# Patient Record
Sex: Female | Born: 1965 | Race: Black or African American | Hispanic: No | Marital: Married | Smoking: Never smoker
Health system: Southern US, Community
[De-identification: ages and names within clinical notes are randomized; demographics above are authoritative.]

## PROBLEM LIST (undated history)

## (undated) DIAGNOSIS — D500 Iron deficiency anemia secondary to blood loss (chronic): Secondary | ICD-10-CM

## (undated) DIAGNOSIS — F419 Anxiety disorder, unspecified: Secondary | ICD-10-CM

## (undated) DIAGNOSIS — Z862 Personal history of diseases of the blood and blood-forming organs and certain disorders involving the immune mechanism: Secondary | ICD-10-CM

## (undated) DIAGNOSIS — Z87448 Personal history of other diseases of urinary system: Secondary | ICD-10-CM

## (undated) DIAGNOSIS — N390 Urinary tract infection, site not specified: Secondary | ICD-10-CM

## (undated) DIAGNOSIS — E538 Deficiency of other specified B group vitamins: Secondary | ICD-10-CM

## (undated) DIAGNOSIS — D649 Anemia, unspecified: Secondary | ICD-10-CM

## (undated) DIAGNOSIS — A499 Bacterial infection, unspecified: Secondary | ICD-10-CM

## (undated) DIAGNOSIS — G4700 Insomnia, unspecified: Secondary | ICD-10-CM

## (undated) DIAGNOSIS — E559 Vitamin D deficiency, unspecified: Secondary | ICD-10-CM

## (undated) DIAGNOSIS — N611 Abscess of the breast and nipple: Secondary | ICD-10-CM

## (undated) DIAGNOSIS — G43909 Migraine, unspecified, not intractable, without status migrainosus: Secondary | ICD-10-CM

## (undated) DIAGNOSIS — Z8619 Personal history of other infectious and parasitic diseases: Secondary | ICD-10-CM

## (undated) DIAGNOSIS — N898 Other specified noninflammatory disorders of vagina: Secondary | ICD-10-CM

## (undated) DIAGNOSIS — N951 Menopausal and female climacteric states: Secondary | ICD-10-CM

## (undated) DIAGNOSIS — J302 Other seasonal allergic rhinitis: Secondary | ICD-10-CM

## (undated) DIAGNOSIS — N939 Abnormal uterine and vaginal bleeding, unspecified: Secondary | ICD-10-CM

## (undated) HISTORY — DX: Deficiency of other specified B group vitamins: E53.8

## (undated) HISTORY — DX: Urinary tract infection, site not specified: A49.9

## (undated) HISTORY — DX: Personal history of other infectious and parasitic diseases: Z86.19

## (undated) HISTORY — DX: Other seasonal allergic rhinitis: J30.2

## (undated) HISTORY — DX: Anemia, unspecified: D64.9

## (undated) HISTORY — DX: Personal history of diseases of the blood and blood-forming organs and certain disorders involving the immune mechanism: Z86.2

## (undated) HISTORY — DX: Migraine, unspecified, not intractable, without status migrainosus: G43.909

## (undated) HISTORY — DX: Personal history of other diseases of urinary system: Z87.448

## (undated) HISTORY — DX: Urinary tract infection, site not specified: N39.0

## (undated) HISTORY — PX: EYE SURGERY: SHX253

## (undated) HISTORY — DX: Abnormal uterine and vaginal bleeding, unspecified: N93.9

## (undated) HISTORY — DX: Iron deficiency anemia secondary to blood loss (chronic): D50.0

## (undated) HISTORY — DX: Vitamin D deficiency, unspecified: E55.9

## (undated) HISTORY — DX: Insomnia, unspecified: G47.00

## (undated) HISTORY — DX: Anxiety disorder, unspecified: F41.9

## (undated) HISTORY — DX: Menopausal and female climacteric states: N95.1

## (undated) HISTORY — DX: Other specified noninflammatory disorders of vagina: N89.8

## (undated) HISTORY — PX: TUBAL LIGATION: SHX77

## (undated) HISTORY — DX: Abscess of the breast and nipple: N61.1

---

## 2003-10-25 ENCOUNTER — Other Ambulatory Visit: Admission: RE | Admit: 2003-10-25 | Discharge: 2003-10-25 | Payer: Self-pay | Admitting: Obstetrics and Gynecology

## 2006-02-10 LAB — HM MAMMOGRAPHY: HM Mammogram: NEGATIVE

## 2006-03-23 LAB — LIPID PANEL
LDL/HDL Ratio: 1.5
TSH: 1.592
Triglycerides: 45
VLDL: 9 mg/dL

## 2008-02-11 HISTORY — PX: TYMPANOPLASTY: SHX33

## 2008-07-11 LAB — CONVERTED CEMR LAB: Pap Smear: NEGATIVE

## 2009-06-08 ENCOUNTER — Encounter: Payer: Self-pay | Admitting: Family Medicine

## 2009-06-15 ENCOUNTER — Encounter: Admission: RE | Admit: 2009-06-15 | Discharge: 2009-06-15 | Payer: Self-pay | Admitting: Family Medicine

## 2009-06-15 ENCOUNTER — Ambulatory Visit: Payer: Self-pay | Admitting: Family Medicine

## 2009-06-18 ENCOUNTER — Encounter: Payer: Self-pay | Admitting: Family Medicine

## 2009-06-18 ENCOUNTER — Ambulatory Visit: Payer: Self-pay | Admitting: Family Medicine

## 2009-06-19 ENCOUNTER — Telehealth: Payer: Self-pay | Admitting: Family Medicine

## 2009-06-20 LAB — CONVERTED CEMR LAB
ALT: 79 units/L — ABNORMAL HIGH (ref 0–35)
AST: 159 units/L — ABNORMAL HIGH (ref 0–37)
Albumin: 3.9 g/dL (ref 3.5–5.2)
Cholesterol: 148 mg/dL (ref 0–200)
Creatinine, Ser: 0.7 mg/dL (ref 0.40–1.20)
Glucose, Bld: 86 mg/dL (ref 70–99)
HCT: 36.6 % (ref 36.0–46.0)
LDL Cholesterol: 78 mg/dL (ref 0–99)
MCHC: 31.4 g/dL (ref 30.0–36.0)
Platelets: 279 10*3/uL (ref 150–400)
Sodium: 140 meq/L (ref 135–145)
Total Bilirubin: 0.2 mg/dL — ABNORMAL LOW (ref 0.3–1.2)
Triglycerides: 67 mg/dL (ref <150)
WBC: 5.3 10*3/uL (ref 4.0–10.5)

## 2009-07-23 ENCOUNTER — Encounter: Payer: Self-pay | Admitting: Family Medicine

## 2009-07-23 ENCOUNTER — Ambulatory Visit: Payer: Self-pay | Admitting: Family Medicine

## 2009-07-23 DIAGNOSIS — R4586 Emotional lability: Secondary | ICD-10-CM | POA: Insufficient documentation

## 2009-07-23 LAB — CONVERTED CEMR LAB
Alkaline Phosphatase: 61 units/L (ref 39–117)
BUN: 11 mg/dL (ref 6–23)
CO2: 22 meq/L (ref 19–32)
Calcium: 9 mg/dL (ref 8.4–10.5)
Glucose, Bld: 91 mg/dL (ref 70–99)
Potassium: 3.9 meq/L (ref 3.5–5.3)
TSH: 1.281 microintl units/mL (ref 0.350–4.500)

## 2009-07-24 ENCOUNTER — Telehealth: Payer: Self-pay | Admitting: *Deleted

## 2009-11-15 ENCOUNTER — Ambulatory Visit: Payer: Self-pay | Admitting: Family Medicine

## 2010-02-13 ENCOUNTER — Telehealth: Payer: Self-pay | Admitting: *Deleted

## 2010-02-19 ENCOUNTER — Ambulatory Visit
Admission: RE | Admit: 2010-02-19 | Discharge: 2010-02-19 | Payer: Self-pay | Source: Home / Self Care | Attending: Family Medicine | Admitting: Family Medicine

## 2010-02-19 DIAGNOSIS — G4700 Insomnia, unspecified: Secondary | ICD-10-CM | POA: Insufficient documentation

## 2010-02-19 LAB — CONVERTED CEMR LAB
Specific Gravity, Urine: 1.025
WBC, UA: >20 cells/hpf
pH: 5.5

## 2010-03-03 ENCOUNTER — Encounter: Payer: Self-pay | Admitting: Obstetrics and Gynecology

## 2010-03-12 NOTE — Assessment & Plan Note (Signed)
Summary: liver test/eo   Vital Signs:  Patient profile:   45 year old female Weight:      193.5 pounds BMI:     28.47 Pulse rate:   78 / minute BP sitting:   118 / 68  (right arm) Cuff size:   regular  Vitals Entered By: Renato Battles slade,cma CC: Ruiz/up labs. may repeat? Is Patient Diabetic? No Pain Assessment Patient in pain? no        Primary Care Provider:  Anahy Esh MD  CC:  Ruiz/up labs. may repeat?.  History of Present Illness: Colleen Ruiz here for Ruiz/u of elevated LFTs at last visit.  When I walked into the exam room, I greeted the patient and asked how she was doing.  She started to become tearful.  I asked what was wrong and she said, "I gained weight.  I feel so bad about that."  (Note: pt has gained 4.5 lbs since last ov 06/15/09).  I asked what happened and she said that she has not been working out as much as she has hoped.  There are times that she is supposed to go to the gym, but she would get upset with her husband and would not go.  We began to discuss why she does this.  She becomes more tearful again.  I asked if there was something else happening that may make her tearful like this.  I asked about her work.  She states that work is stressful, but is always stressful (she works at [employer] in [place]).  I asked how her children were doing.  She states that she misses her son tremendously.  He has recently left home for the [employer].  She states that her life has been taking care of her children and her family.  I asked if she was doing anything fun, just for herself.  She states that her mother did not take care of her well so she made it a point to always be available for her family.  We began to discuss that she should take time for herself too.  She likes to go dancing and she likes to spend time with her sister.  Her sister is also married, but will not going anywhere without her husband and her husband does not enjoy dancing, so Nelia and her sister don't get to go dancing.  I  asked about spending time with her daughter, like a "Girls' Day."  She said that she hadn't thought of that, but it would be fun because she and her daughter have a good time toghether.    During the rest of the conversation Linnette revealed has seen a counselor in the past (10 yrs ago) and thougth that it was helpful.       Habits & Providers  Alcohol-Tobacco-Diet     Alcohol drinks/day: 0     Tobacco Status: never  Current Medications (verified): 1)  Ambien 10 Mg Tabs (Zolpidem Tartrate) .Marland Kitchen.. 1 By Mouth At Bedtime For Insomnia 2)  Multivitamins  Tabs (Multiple Vitamin) 3)  Cetirizine Hcl 10 Mg Tabs (Cetirizine Hcl) .Marland Kitchen.. 1 By Mouth Each Night 4)  Hydrocortisone 1 % Crea (Hydrocortisone) .... Apply To Affected Areas Two Times A Day. 30 Gram.  Allergies (verified): 1)  ! Sulfa  Past History:  Past Medical History: Last updated: 06/15/2009 HTN Anemia Last pap 07/2008: Dr [name]  Last MMG 07/2018: [facility]  Past Surgical History: Last updated: 06/08/2009 C-Section 11/22/1988 BLT 10/1990 [facility] Sx 2000  Right eardrum repaired 2010  Family History: Last updated: 06/15/2009 Father: passed at age 52; Lung cancer, Heart Disease Mother: HTN DM: Mat grandfather Twin sister with Cervical cancer s/p Hysterectomy Breast cancer: maternal uncle  Social History: Last updated: 06/15/2009 Lives with Husband Nykayla Marcelli, 1966) in [place] Children 23 son, 79 y/o daughter Pets: Psychologist, 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         Mood:  Sad, tearful.   no SI/HI Counseling 10 yrs ago.  Believed that it helped.   Declined starting any meds at this time.     Seems very receptive to speaking with Dr Pascal Lux.    +constipation +hearing her heart rate in her ears -palpitations +fatigue  Physical Exam  General:  Well-developed,well-nourished,in no acute distress; alert,appropriate and cooperative throughout examination. vitals reviewed.   Neck:  No deformities, masses, or tenderness noted. Lungs:  Normal respiratory effort, chest expands symmetrically. Lungs are clear to auscultation, no crackles or wheezes. Heart:  Normal rate and regular rhythm. S1 and S2 normal without gallop, murmur, click, rub or other extra sounds. Psych:  Cognition and judgment appear intact. Alert and cooperative with normal attention span and concentration. No apparent delusions, illusions, hallucinations.normally interactive, good eye contact, not anxious appearing, not agitated, not suicidal, not homicidal, and tearful.     Impression & Recommendations:  Problem # 1:  EMOTIONAL LABILITY (UJW-119.14) Assessment New I did not intend to discuss pt's mood today, but she became tearful during ov (see hpi).  She denies SI/HI.  I will not make Dx of depression at this time.  Pt declined offer of meds.  She was receptive to speaking with Dr Pascal Lux.  I have given her Dr Carola Rhine phone number.  Will check TSH. I will call pt to discuss lab result and Ruiz/u appt.   Orders: [facility]- Est  Level 4 (78295)  Problem # 2:  FATIGUE (ICD-780.79) Assessment: New May be related to emotional lability, stress from work.  Hb in 06/2009 mildly low, but normal in menstruating female, MCV normal at 86.  Will check TSH.   Orders: [facility]SH[facility] (62130-86578) [facility]- Est  Level 4 (99214)  Problem # 3:  OTHER NONSPECIFIC ABNORMAL SERUM ENZYME LEVELS (ICD-790.5) Assessment: Unchanged LFTs elevated with AST:ALT 2:1.  Pt denies etoh consumption.  Will repeat lab today.   Orders: Comp Met-[facility] ([id number]) [facility]- Est  Level 4 (13244)  Complete Medication List: 1)  Ambien 10 Mg Tabs (Zolpidem tartrate) .Marland Kitchen.. 1 by  mouth at bedtime for insomnia 2)  Multivitamins Tabs (Multiple vitamin) 3)  Cetirizine Hcl 10 Mg Tabs (Cetirizine hcl) .Marland Kitchen.. 1 by mouth each night 4)  Hydrocortisone 1 % Crea (Hydrocortisone) .... Apply to affected areas two times a day. 30 gram.  Patient Instructions: 1)  Please schedule a follow-up appointment in 1 month.  2)  I'm sorry you are feeling sad today. 3)  We have discussed some things that can make you feel better. 4)  One of the things you said that may help you feel better is to go out.  You said that you will call your daughter to go out with you. 5)  You also stated that you would like to speak with Dr Pascal Lux. 6)  Her phone number is [phone].  Please call her to schedule an appointment when you are ready.  7)  I'll call you with lab results.   Prescriptions: HYDROCORTISONE 1 % CREA (HYDROCORTISONE) apply to affected areas two times a day. 30 gram.  #1 x 3   Entered and Authorized by:   Angeline Slim MD   Signed by:   Angeline Slim MD on 07/23/2009   Method used:   Electronically to        [facility] [phone]* (retail)       [address]       [place], [place]  [address]       Ph: [phone] or [phone]       Fax: [phone]   RxID:   [id number]

## 2010-03-12 NOTE — Assessment & Plan Note (Signed)
Summary: low back pain   Vital Signs:  Patient profile:   45 year old female Weight:      195.2 pounds Temp:     98.2 degrees F oral Pulse rate:   81 / minute Pulse rhythm:   regular BP sitting:   134 / 86  (left arm) Cuff size:   regular  Vitals Entered By: Loralee Pacas CMA (November 15, 2009 9:02 AM) CC: ? pulled muscle   Primary Care Provider:  Cat Ta MD  CC:  ? pulled muscle.  History of Present Illness: lower back pain: achy, spasms in lower back x 4-5 days. tylenol makes it better in the day.  pain increases at night.   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 no fever.  no urinary or bowel complaints.  States that she had similiar episode of back pain approx 3 years ago.  Habits & Providers  Alcohol-Tobacco-Diet     Alcohol drinks/day: 0     Tobacco Status: never  Current Medications (verified): 1)  Ambien 10 Mg Tabs (Zolpidem Tartrate) .Marland Kitchen.. 1 By Mouth At Bedtime For Insomnia 2)  Multivitamins  Tabs (Multiple Vitamin) 3)  Cetirizine Hcl 10 Mg Tabs (Cetirizine Hcl) .Marland Kitchen.. 1 By Mouth Each Night 4)  Hydrocortisone 1 % Crea (Hydrocortisone) .... Apply To Affected Areas Two Times A Day. 30 Gram. 5)  Flexeril 10 Mg Tabs (Cyclobenzaprine Hcl) .... Take 1 Tablet As Needed For Back Pain At Bedtime  Allergies (verified): 1)  ! Sulfa  Review of Systems       as per hpi  Physical Exam  General:  VSS Well-developed,well-nourished,in no acute distress; alert,appropriate and cooperative throughout examination Msk:  pain with palpation of lower back.   Right lower back pain> than left lower back pain.  negative straight leg raise. strength 5/5 in all 4 extremities.  sensation grossly intact.  Reflexes equal bilateral.    Impression & Recommendations:  Problem # 1:  LOW BACK PAIN, MILD (ICD-724.2) Pt will take tylenol and motrin as directed during the day for pain.  At nighttime can take a flexeril.  If symptoms do not resolve within a week or  if any fever, bowel or bladder changes, or new or worsening of symptoms pt is to return to care.   Her updated medication list for this problem includes:    Flexeril 10 Mg Tabs (Cyclobenzaprine hcl) .Marland Kitchen... Take 1 tablet as needed for back pain at bedtime  Orders: [facility]- Est Level  3 (91478)  Complete Medication List: 1)  Ambien 10 Mg Tabs (Zolpidem tartrate) .Marland Kitchen.. 1 by mouth at bedtime for insomnia 2)  Multivitamins Tabs (Multiple vitamin) 3)  Cetirizine Hcl 10 Mg Tabs (Cetirizine hcl) .Marland Kitchen.. 1 by mouth each night 4)  Hydrocortisone 1 % Crea (Hydrocortisone) .... Apply to affected areas two times a day. 30 gram. 5)  Flexeril 10 Mg Tabs (Cyclobenzaprine hcl) .... Take 1 tablet as needed for back pain at bedtime  Other Orders: Influenza Vaccine NON MCR (29562)  Patient Instructions: 1)  During the day take tylenol 1000mg  every 8 hours you can alternate with ibuprofen 600mg  every 6 hours. 2)  Take flexeril as needed at nighttime. 3)  return if any fever or any changes in bowel or bladder.  Prescriptions: FLEXERIL 10 MG TABS (CYCLOBENZAPRINE HCL) take 1 tablet as needed for back pain at bedtime  #25 x 0   Entered and Authorized by:   Ellin Mayhew MD  Signed by:   Ellin Mayhew MD on 11/15/2009   Method used:   [facility] to        [facility] [phone]* (retail)       [address]       [place], [place]  [address]       Ph: [phone] or [phone]       Fax: [phone]   RxID:   [id number]    Influenza Vaccine    Vaccine Type: Fluvax Non-MCR    Site: left deltoid    Mfr: GlaxoSmithKline    Dose: 0.5 ml    Route: IM    Given by: Garen Grams LPN    Exp. Date: 08/07/2010    Lot #: [id number]    VIS given: 09/04/09 version given November 15, 2009.  Flu Vaccine Consent Questions    Do you have a history of severe allergic reactions to this vaccine? no    Any prior history of allergic reactions to egg and/or gelatin? no    Do you have a  sensitivity to the preservative Thimersol? no    Do you have a past history of Guillan-Barre Syndrome? no    Do you currently have an acute febrile illness? no    Have you ever had a severe reaction to latex? no    Vaccine information given and explained to patient? yes    Are you currently pregnant? no

## 2010-03-12 NOTE — Miscellaneous (Signed)
Summary: Pmhx info  Clinical Lists Changes  Observations: Added new observation of NKA: T (06/08/2009 18:27) Added new observation of ALLERGY REV: Done (06/08/2009 18:27) Added new observation of FAMILY HX: Father: passed at age 45; Lung cancer, Heart Disease Mother:  (06/08/2009 18:27) Added new observation of EXERCISE: 5 /wk (06/08/2009 18:27) Added new observation of EXERCISE TYP: walking, exercise machine  (06/08/2009 18:27) Added new observation of APPROP EXERC: not indicated; exercise is adequate  (06/08/2009 18:27) Added new observation of REGULAREXERC: yes  (06/08/2009 18:27) Added new observation of SOCIAL HX: Lives with Husband Colleen Ruiz, 1966) Pets: Beagles Employed at _____ Education: Some college ____  Never Tobacco, No alcohol, No drugs  Cell: [phone]  (06/08/2009 18:27) Added new observation of PAST SURG HX: C-Section 11/22/1988 BLT 10/1990 [facility] Sx 2000 Right eardrum repaired 2010  (06/08/2009 18:27) Added new observation of PAST MED HX: HTN Anemia Last pap 2010: Dr [name] Last MMG 2008: [facility]  (06/08/2009 18:27)       Past History:  Past Medical History: HTN Anemia Last pap 2010: Dr [name] Last MMG 2008: [facility]  Past Surgical History: C-Section 11/22/1988 BLT 10/1990 [facility] Sx 2000 Right eardrum repaired 2010   Family History: Father: passed at age 46; Lung cancer, Heart Disease Mother:   Social History: Lives with Husband Colleen Ruiz, 1966) Pets: Beagles Employed at _____ Education: Some college ____  Never Tobacco, No alcohol, No drugs  Cell: [phone] Patient Exercise:  yes   Habits & Providers  Exercise-Depression-Behavior     Does Patient Exercise: yes     Exercise Counseling: not indicated; exercise is adequate     Type of exercise: walking, exercise machine     Times/week: 5    Allergies (verified): No Known Drug Allergies

## 2010-03-12 NOTE — Progress Notes (Signed)
Summary: phn msg  Phone Note Call from Patient Call back at Work Phone [phone]   Caller: Patient Summary of Call: pt is returning call Initial call taken by: De Nurse,  July 24, 2009 10:38 AM  Follow-up for Phone Call        Called to let pt know that Liver looks fine.  Thyroid looks fine. Follow-up by: Angeline Slim MD,  July 24, 2009 2:47 PM  Additional Follow-up for Phone Call Additional follow up Details #1::        CALLED PT AND INFORMED OF LABS WNL Additional Follow-up by: Arlyss Repress CMA,,  July 24, 2009 5:07 PM

## 2010-03-12 NOTE — Assessment & Plan Note (Signed)
Summary: NP,tcb   Vital Signs:  Patient profile:   45 year old female Height:      69.25 inches Weight:      189 pounds BMI:     27.81 Pulse rate:   84 / minute BP sitting:   131 / 83  (right arm)  Vitals Entered By: Arlyss Repress CMA, (Jun 15, 2009 2:32 PM)  Nutrition Counseling: Patient's BMI is greater than 25 and therefore counseled on weight management options. CC: establishment with new PCP.  last T-dap ? Is Patient Diabetic? No Pain Assessment Patient in pain? no        Chief Complaint:  establishment with new PCP.  last T-dap ?Colleen Ruiz  History of Present Illness: 45 y/o F here as new patient.    [facility] PCP:  [facility]  Overweight:  BMI 27.  She Drinks: 3 x 20z sodas daily, usually [place] but has switched to [employer].  Last week she started only drinking one-12oz coke per day.  Drinking more water lately.  She is exercising now.  She has joined the [employer] with her husband.  They go at least 3x/week.    Family Planning:  BTL 19 yrs ago  Last pap 07/2008 negative.  No history of abnormal paps.  Dr Colleen Ruiz.  MMG: 07/2008. [facility]  Insomnia:  Takes Ambien almost every night to help her sleep  Cough x 2 months: was treated for bronchitis with atbx x 10 days.  no sputum.  worse at night.  no fevers, chills, no wheezing.  no rhinorrhea.  no ringing in ear.  + drainage + muffled ears.     Habits & Providers  Alcohol-Tobacco-Diet     Alcohol drinks/day: 0     Tobacco Status: never  Exercise-Depression-Behavior     Does Patient Exercise: yes     Exercise Counseling: [employer]     Type of exercise: treadmill, weights     Exercise (avg: min/session): 30-60     Times/week: 3     Have you felt down or hopeless? no     STD Risk: current     Drug Use: never     Seat Belt Use: always     Sun Exposure: rarely  Current Medications (verified): 1)  Ambien 10 Mg Tabs (Zolpidem Tartrate) .Colleen Ruiz.. 1 By Mouth At Bedtime For Insomnia 2)  Multivitamins  Tabs  (Multiple Vitamin) 3)  Cetirizine Hcl 10 Mg Tabs (Cetirizine Hcl) .Colleen Ruiz.. 1 By Mouth Each Night  Allergies (verified): 1)  ! 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 [place] Education: Some college  Never Tobacco, No alcohol, No drugs  Cell: [phone]  Risk Factors: Alcohol Use: 0 (06/15/2009) Exercise: yes (06/15/2009)  Risk Factors: Smoking Status: never (06/15/2009)  Past Medical History: HTN Anemia Last pap 07/2008: Dr Colleen Ruiz  Last MMG 07/2018: [facility]  Family History: Father: passed at age 27; Lung cancer, Heart Disease Mother: HTN DM: Mat grandfather Twin sister with Cervical cancer s/p Hysterectomy Breast cancer: maternal uncle  Social History: Lives with Husband Colleen Ruiz  Colleen Ruiz, 1966) in [place] Children 32 son, 49 y/o daughter Pets: Psychologist, 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 CV:  Denies chest pain or discomfort, difficulty breathing at night, difficulty breathing while lying down, fainting, fatigue, leg cramps with exertion, lightheadness, near fainting, palpitations, shortness of breath with exertion, swelling of  feet, and swelling of hands. Resp:  Complains of cough; denies chest discomfort, chest pain with inspiration, coughing up blood, excessive snoring, hypersomnolence, morning headaches, pleuritic, shortness of breath, sputum productive, and wheezing. GI:  Denies abdominal pain, bloody stools, change in bowel habits, constipation, diarrhea, nausea, and vomiting. GU:  Denies abnormal vaginal bleeding and discharge. MS:  Denies joint pain, joint redness, joint swelling, loss of strength, low back pain, mid back pain, and muscle aches.  Physical Exam  General:  Well-developed,well-nourished,in no acute distress; alert,appropriate and cooperative throughout examination. vitals reviewed.  Mouth:  Oral mucosa and oropharynx without lesions or exudates.  Teeth in good repair. Neck:  No deformities, masses, or tenderness noted. Lungs:  Normal respiratory effort, chest expands symmetrically. Lungs are clear to auscultation, no crackles or wheezes. Heart:  Normal rate and regular rhythm. S1 and S2 normal without gallop, murmur, click, rub or other extra sounds. Abdomen:  Bowel sounds positive,abdomen soft and non-tender without masses, organomegaly or hernias noted. Msk:  No deformity or scoliosis noted of thoracic or lumbar spine.   Pulses:  R and L carotid,radial,femoral,dorsalis pedis and posterior tibial pulses are full and equal bilaterally Extremities:  No clubbing, cyanosis, edema, or deformity noted with normal full range of motion of all joints.   Neurologic:  No cranial nerve deficits noted. Station and gait are normal. Plantar reflexes are down-going bilaterally. DTRs are symmetrical throughout. Sensory, motor and coordinative functions appear intact. Skin:  Intact without suspicious lesions or rashes Cervical Nodes:  No lymphadenopathy noted Axillary Nodes:  No palpable lymphadenopathy Psych:  Oriented X3 and memory intact for recent and remote.      Impression & Recommendations:  Problem #  1:  PHYSICAL EXAMINATION (ICD-V70.0) Assessment New Pt healthy.  NO red flags.  Will check CBC, CMET, FLP.  Pt will get Pap smear at Dr Colleen Ruiz.  Will get MMG in June. Future Orders: CBC-[facility] ([id number]) ... 06/22/2010 Comp Met-[facility] ([id number]) ... 06/21/2010  Problem # 2:  COUGH (ICD-786.2) Assessment: Unchanged Cough present for > 2 months.  No tobacco or wegiht loss.  Will check CXR.  ?post nasal drip.  If CXR negative will try Zyrtec.   Orders: CXR- 2view (CXR)  Problem # 3:  OVERWEIGHT (ICD-278.02) Assessment: Unchanged BMI 27.8.  Pt has personal goal of weighing 167 pound, because that will get her BMI 24.  She is exercising now and is cutting down on sodas.  She seems very motivated.  With these lifestyle modifications she should be able to see weight loss soon.  Will check FLP. Future Orders: Lipid-[facility] ([id number]) ... 06/16/2009  Complete Medication List: 1)  Ambien 10 Mg Tabs (Zolpidem tartrate) .Colleen Ruiz.. 1 by mouth at bedtime for insomnia 2)  Multivitamins Tabs (Multiple vitamin) 3)  Cetirizine Hcl 10 Mg Tabs (Cetirizine hcl) .Colleen Ruiz.. 1 by mouth each night  Patient Instructions: 1)  Please schedule a follow-up appointment as needed.  2)  Please make appt for cholesterol check.  This has to be fasting, so no food or drink after midnight the night before appointment. 3)  I will give you a prescription for Zyrtec, we will discuss this by phone before you should take this.  Prescriptions: CETIRIZINE HCL 10 MG TABS (CETIRIZINE HCL) 1 by mouth each night  #30 x 11   Entered and Authorized by:   Angeline Slim MD   Signed by:   Angeline Slim MD on 06/15/2009   Method used:   Print then Mail to Patient   RxID:   [id number] AMBIEN 10 MG TABS (ZOLPIDEM TARTRATE) 1 by mouth at bedtime for insomnia  #30 x 11   Entered and Authorized by:   Angeline Slim MD   Signed by:   Angeline Slim MD on 06/15/2009   Method used:   Print then Mail to Patient   RxID:   [id number]    Prevention & Chronic  Care Immunizations   Influenza vaccine: Not documented    Tetanus booster: Not documented    Pneumococcal vaccine: Not documented  Other Screening   Pap smear: Interpretation/Result:Negative for intraepithelial Lesion or Malignancy.   Done by Dr Michele Mcalpine, per pt's report.     (07/11/2008)   Pap smear due: 07/2009    Mammogram: No significant changes compared to previous study.  Location[facility].     (07/11/2008)   Mammogram due: 07/2009   Smoking status: never  (06/15/2009)  Lipids   Total Cholesterol: Not documented   LDL: Not documented   LDL Direct: Not documented   HDL: Not documented   Triglycerides: Not documented    Mammogram  Procedure date:  07/11/2008  Findings:      No significant changes compared to previous study.  Location: [facility].     Pap Smear  Procedure date:  07/11/2008  Findings:      Interpretation/Result:Negative for intraepithelial Lesion or Malignancy.   Done by Dr Michele Mcalpine, per pt's report.     Comments:      Repeat Pap in 1 year.    Procedures Next Due Date:    Mammogram: 07/2009    Pap Smear: 07/2009

## 2010-03-12 NOTE — Progress Notes (Signed)
Summary: lab result  Called pt to discuss lab result, specifically AST/ALT > 2:1 ratio.  I am concerned about alcohol consumption.  Left message that I would like to discuss results, but not specific labs. Deeanna Beightol MD  Jun 19, 2009 11:41 AM

## 2010-03-14 NOTE — Progress Notes (Signed)
Summary: Rx  Phone Note Call from Patient   Caller: Patient Summary of Call: Pt calling regarding fax sent for her Ambien rx.  Please fax back and call her  when ready Initial call taken by: Abundio Miu,  February 13, 2010 11:09 AM  Follow-up for Phone Call        called in Rx to pharmacy. notified pt Follow-up by: Tessie Fass CMA,  February 14, 2010 2:37 PM    Prescriptions: AMBIEN 10 MG TABS (ZOLPIDEM TARTRATE) 1 by mouth at bedtime for insomnia  #30 x 0   Entered and Authorized by:   Angeline Slim MD   Signed by:   Angeline Slim MD on 02/14/2010   Method used:   Telephoned to ...       [facility] [phone][facility] (retail)       [address]       [place], [place]  [address]       Ph: [phone] or [phone]       Fax: [phone]   RxID:   [id number]  I will refill for one month.  I would like to see pt in the office for anymore refills. Cat Ta MD  February 14, 2010 11:24 AM   pt made appt for 1/10, would like [place] to call pharmacy about rx bc they told her we denied it. Knox Royalty  February 14, 2010 12:30 PM

## 2010-03-14 NOTE — Assessment & Plan Note (Signed)
Summary: Dysuria    Vital Signs:  Patient profile:   45 year old female Height:      69.25 inches Weight:      194 pounds BMI:     28.55 Temp:     97.8 degrees F oral Pulse rate:   76 / minute BP sitting:   123 / 81  (left arm) Cuff size:   regular  Vitals Entered By: Tessie Fass CMA (February 19, 2010 2:20 PM) CC: F/U meds Pain Assessment Patient in pain? no        Primary Care Provider:  Nora Sabey MD  CC:  F/U meds.  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  Pt feeling better. Trying to wean off of Ambien on her own. Taking melatonin.    DYSURIA Onset:  3 days Description: pelvic pressure and  Modifying factors: Pt describes feeling pressure when she voids and increased urgency to void  Symptoms Urgency:  yes Frequency: yes Hesitancy:  no Hematuria:  no Flank Pain:  no Fever: ? Nausea/Vomiting:  no Missed LMP: no, LMP 2/02/10/10 STD exposure: no Discharge: no Irritants: no Rash: no  Red Flags   More than 3 UTI's last 12 months:  no PMH of  Diabetes or Immunosuppression:  no Renal Disease/Calculi: no Urinary Tract Abnormality:  no Instrumentation or Trauma: no    Current Medications (verified): 1)  Ambien 10 Mg Tabs (Zolpidem Tartrate) .Marland Kitchen.. 1 By Mouth At Bedtime For Insomnia 2)  Multivitamins  Tabs (Multiple Vitamin) 3)  Cetirizine Hcl 10 Mg Tabs (Cetirizine Hcl) .Marland Kitchen.. 1 By Mouth Each Night 4)  Hydrocortisone 1 % Crea (Hydrocortisone) .... Apply To Affected Areas Two Times A Day. 30 Gram. 5)  Flexeril 10 Mg Tabs (Cyclobenzaprine Hcl) .... Take 1 Tablet As Needed For Back Pain At Bedtime  Allergies (verified): 1)  ! Sulfa  Past History:  Past Medical History: Last updated: 06/15/2009 HTN Anemia Last pap 07/2008: Dr [name]  Last MMG 07/2018: [facility]  Past Surgical History: Last updated: 06/08/2009 C-Section 11/22/1988 BLT  10/1990 [facility] Sx 2000 Right eardrum repaired 2010  Family History: Last updated: 06/15/2009 Father: passed at age 63; Lung cancer, Heart Disease Mother: HTN DM: Mat grandfather Twin sister with Cervical cancer s/p Hysterectomy Breast cancer: maternal uncle  Social History: Last updated: 06/15/2009 Lives with Husband Eileene Kisling, 1966) in [place] Children 91 son, 18 y/o daughter Pets: Psychologist, clinical Employed at [employer] in [place] Education: Some college  Never Tobacco, No alcohol, No drugs  Cell: [phone]  Risk Factors: Alcohol Use: 0 (11/15/2009) Exercise: yes (06/15/2009)  Risk Factors: Smoking Status: never (11/15/2009)  Physical Exam  General:  Well-developed,well-nourished,in no acute distress; alert,appropriate and cooperative throughout examination. Vitals reviewed.  Lungs:  Normal respiratory effort, chest expands symmetrically. Lungs are clear to auscultation, no crackles or wheezes. Heart:  Normal rate and regular rhythm. S1 and S2 normal without gallop, murmur, click, rub or other extra sounds. Abdomen:  Bowel sounds positive,abdomen soft and non-tender without masses, organomegaly or hernias noted. Msk:  No deformity or scoliosis noted of thoracic or lumbar spine.   Inguinal Nodes:  No significant adenopathy   Impression & Recommendations:  Problem # 1:  INSOMNIA (ICD-780.52) Assessment Improved  Discussed the longterm affects of ambien (habit forming).  Pt has been trying to wean off of Ambien.  She is taking melatonin to help sleep.  We discussed refilling for #20 next time and #10 the time following that.  Discussed healthy sleep hygiene.  She is in agreement with plan.    Her updated medication list for this problem includes:    Ambien 10 Mg Tabs (Zolpidem tartrate) .Marland Kitchen... 1 by mouth at bedtime for insomnia  Orders: [facility]- Est  Level 4 ([address])  Problem # 2:  DYSURIA (ICD-788.1) Assessment: New  Dysuria x 3 days.  UA with blood, mod leuk,  and bacteria.  Will treat with Keflex.   Orders: Urinalysis-[facility] (00000) [facility]- Est  Level 4 ([address])  Her updated medication list for this problem includes:    Keflex 500 Mg Caps (Cephalexin) .Marland Kitchen... 1 tab by mouth three times a day x 5 days  Complete Medication List: 1)  Ambien 10 Mg Tabs (Zolpidem tartrate) .Marland Kitchen.. 1 by mouth at bedtime for insomnia 2)  Multivitamins Tabs (Multiple vitamin) 3)  Cetirizine Hcl 10 Mg Tabs (Cetirizine hcl) .Marland Kitchen.. 1 by mouth each night 4)  Hydrocortisone 1 % Crea (Hydrocortisone) .... Apply to affected areas two times a day. 30 gram. 5)  Flexeril 10 Mg Tabs (Cyclobenzaprine hcl) .... Take 1 tablet as needed for back pain at bedtime 6)  Keflex 500 Mg Caps (Cephalexin) .Marland Kitchen.. 1 tab by mouth three times a day x 5 days Prescriptions: KEFLEX 500 MG CAPS (CEPHALEXIN) 1 tab by mouth three times a day x 5 days  #15 x 0   Entered and Authorized by:   Angeline Slim MD   Signed by:   Angeline Slim MD on 02/19/2010   Method used:   Electronically to        [facility] [phone]* (retail)       [address]       [place], [place]  [address]       Ph: [phone] or [phone]       Fax: [phone]   RxID:   [id number] FLEXERIL 10 MG TABS (CYCLOBENZAPRINE HCL) take 1 tablet as needed for back pain at bedtime  #25 x 0   Entered and Authorized by:   Angeline Slim MD   Signed by:   Angeline Slim MD on 02/19/2010   Method used:   Electronically to        [facility] [phone]* (retail)       [address]       [place], [place]  [address]       Ph: [phone] or [phone]       Fax: [phone]   RxID:   [id number]    Orders Added: 1)  Urinalysis-[facility] [00000] 2)  [facility]- Est  Level 4 [[address]]    Laboratory Results   Urine Tests  Date/Time Received: February 19, 2010 2:34 PM  Date/Time Reported: February 19, 2010 3:13 PM   Routine Urinalysis   Color: yellow Appearance: Clear Glucose: negative   (Normal Range:  Negative) Bilirubin: negative   (Normal Range: Negative) Ketone: negative   (Normal Range: Negative) Spec. Gravity: 1.025   (Normal Range: 1.003-1.035) Blood: moderate   (Normal Range: Negative) pH: 5.5   (Normal Range: 5.0-8.0) Protein: negative   (Normal Range: Negative) Urobilinogen: 0.2   (Normal Range: 0-1) Nitrite: negative   (Normal Range: Negative) Leukocyte Esterace: trace   (Normal Range: Negative)  Urine Microscopic WBC/HPF: >20 with clumps Bacteria/HPF: 3+ Mucous/HPF: 2+ Epithelial/HPF: 10-20 with and occ  clue cell    Comments: ...............test performed by......Marland KitchenBonnie A. Swaz[place], MLS (ASCP)cm

## 2010-04-11 ENCOUNTER — Telehealth: Payer: Self-pay | Admitting: Family Medicine

## 2010-04-11 NOTE — Telephone Encounter (Signed)
Pt concerned about refill for ambien.  Discussed lowering dose not cutting it out.  Pt is requesting to talk to Dr Janalyn Harder

## 2010-04-11 NOTE — Telephone Encounter (Signed)
Called pt back at home.  After reviewing last ov note on 02/19/10: 30 tablets for Jan, then 20 tabs for next time, then 10 tabs.  Pt ok with this plan.  I will call it in to her pharmacy.

## 2010-05-27 ENCOUNTER — Telehealth: Payer: Self-pay | Admitting: Family Medicine

## 2010-05-27 MED ORDER — ZOLPIDEM TARTRATE 10 MG PO TABS: 10.0000 mg | ORAL_TABLET | Freq: Every evening | ORAL | Status: DC | PRN

## 2010-05-27 NOTE — Telephone Encounter (Signed)
I've refilled it for #10.  Will call to her pharmacy.

## 2010-05-27 NOTE — Telephone Encounter (Signed)
Pt tearful and states that she will need more than 10 days worth of her Ambien.  Needs to talk to Dr Janalyn Harder asap.

## 2010-05-27 NOTE — Telephone Encounter (Signed)
Called pt back.  Got voicemail.  Left message.  Asked pt to make appt with me for this week so that we can discuss the situation.

## 2010-05-27 NOTE — Telephone Encounter (Signed)
Has not gotten the previous 10 b/c she needs prior approval to have it filled - has appt for 4/20 but needs this today [facility] [address] [facility]

## 2010-05-31 ENCOUNTER — Ambulatory Visit: Payer: Self-pay | Admitting: Family Medicine

## 2011-03-03 ENCOUNTER — Encounter: Payer: Self-pay | Admitting: Emergency Medicine

## 2011-03-03 ENCOUNTER — Ambulatory Visit (INDEPENDENT_AMBULATORY_CARE_PROVIDER_SITE_OTHER): Payer: BC Managed Care – PPO | Admitting: Emergency Medicine

## 2011-03-03 VITALS — BP 121/82 | HR 87 | Ht 69.5 in | Wt 190.0 lb

## 2011-03-03 DIAGNOSIS — Z0000 Encounter for general adult medical examination without abnormal findings: Secondary | ICD-10-CM | POA: Insufficient documentation

## 2011-03-03 DIAGNOSIS — G4700 Insomnia, unspecified: Secondary | ICD-10-CM

## 2011-03-03 LAB — COMPREHENSIVE METABOLIC PANEL
ALT: 9 U/L (ref 0–35)
Albumin: 4.1 g/dL (ref 3.5–5.2)
Alkaline Phosphatase: 59 U/L (ref 39–117)
CO2: 25 mEq/L (ref 19–32)
Calcium: 9.1 mg/dL (ref 8.4–10.5)
Creat: 0.67 mg/dL (ref 0.50–1.10)
Glucose, Bld: 90 mg/dL (ref 70–99)
Sodium: 138 mEq/L (ref 135–145)
Total Protein: 7 g/dL (ref 6.0–8.3)

## 2011-03-03 LAB — LIPID PANEL
Cholesterol: 171 mg/dL (ref 0–200)
LDL Cholesterol: 104 mg/dL — ABNORMAL HIGH (ref 0–99)

## 2011-03-03 MED ORDER — ZOLPIDEM TARTRATE 10 MG PO TABS: 10.0000 mg | ORAL_TABLET | Freq: Every evening | ORAL | Status: DC | PRN

## 2011-03-03 NOTE — Patient Instructions (Signed)
It was very nice to meet you!  I'm sorry you're having trouble with your sleep.  It sounds like you are doing every thing right.  The only thing I would like for you change is NO CAFFEINE AFTER 2PM.  I have also sent a prescription for a few Ambien to your pharmacy.  Please use this only when absolutely needed and when you can have a solid 8 hours to sleep.  I expect that your sleep will improve with stopping caffeine in the afternoons/evenings and you will no longer need a sleep aid.  We also did blood work today.  I will send you a letter with the results in the next 2 weeks.  If some comes up on the labs, I will give you a call.

## 2011-03-03 NOTE — Assessment & Plan Note (Signed)
Doing well.  Discussed BMI goal of <25, regulated sleep and less soda will help.  Pap smear up to date via OB.  Will obtain fasting lipid panel and Cmet for insurance.

## 2011-03-03 NOTE — Progress Notes (Signed)
  Subjective:    Patient ID: Colleen Ruiz, female    DOB: May 20, 1965, 46 y.o.   MRN: [id number]  HPI Colleen Ruiz is here today for a physical and insomnia.  1. Physical: Doing well.  Would like to lose about 20 pounds.  Knows that her soda intake does not help with this goal, although she has cut back some on the soda.  Up to date on her health maintance.  Would like labs for her insurance.  2. Insomnia: Describes difficulty with both falling asleep and staying asleep.  Has been on Ambien in the past which helped.  Has been trying OTC sleep agents with no improvement.  She states she wakes up every morning around 7 am.  Typically goes to bed at 10-11pm, tries not to get into bed until feeling sleepy.  Denies daytime naps.  Reserves bed for sleep and sex. No longer watches TV in bedroom.  No alcohol use.  Does drink Coke everyday with last soda around 7-9pm.   I have reviewed and updated the following as appropriate: allergies, current medications, past family history, past medical history, past social history, past surgical history and problem list.    Review of Systems Negative except as in HPI    Objective:   Physical Exam Vitals: reviewed General: well-nourished, alert, cooperative, NAD HEENT: AT/[place], sclera white CV: RRR, no murmurs Pulm: CTAB, no wheezes, rales Ext: no peripheral edema Skin: normal Neuro: normal gait     Assessment & Plan:

## 2011-03-04 ENCOUNTER — Encounter: Payer: Self-pay | Admitting: Emergency Medicine

## 2011-03-13 ENCOUNTER — Encounter: Payer: Self-pay | Admitting: Emergency Medicine

## 2011-03-31 ENCOUNTER — Telehealth: Payer: Self-pay | Admitting: Emergency Medicine

## 2011-03-31 DIAGNOSIS — G4700 Insomnia, unspecified: Secondary | ICD-10-CM

## 2011-03-31 MED ORDER — ZOLPIDEM TARTRATE 10 MG PO TABS: 10.0000 mg | ORAL_TABLET | Freq: Every evening | ORAL | Status: DC | PRN

## 2011-03-31 NOTE — Telephone Encounter (Signed)
I have sent a prescription for Ambien 10mg  electronically to [facility] [address].  Please call the patient to let her know.

## 2011-03-31 NOTE — Telephone Encounter (Signed)
Rx for Colleen Ruiz  called to pharmacy. Cannot be sent electronically,. Patient notified.

## 2011-03-31 NOTE — Telephone Encounter (Signed)
States that pharmacy has never rec'd script for her Ambien [facility]

## 2011-03-31 NOTE — Telephone Encounter (Signed)
Spoke with patient and she states pharmacy has been contacting wrong office  for past 2 weeks. She would like this sent in today . Advised patient that Dr. Elwyn Reach will be in clinic this afternoon and will ask her to fax the medication or either call in.

## 2011-04-26 ENCOUNTER — Other Ambulatory Visit: Payer: Self-pay | Admitting: Family Medicine

## 2011-04-27 NOTE — Telephone Encounter (Signed)
Refill request

## 2011-04-28 NOTE — Telephone Encounter (Signed)
Refilled flexeril.

## 2011-04-30 IMAGING — CR DG CHEST 2V
2 series · 2 of 2 positions shown · non-contrast
Comparison: None.

CLINICAL DATA: 2 months persistent cough, nonsmoker.

CHEST - 2 VIEW

[view not recorded (1 of 2)]
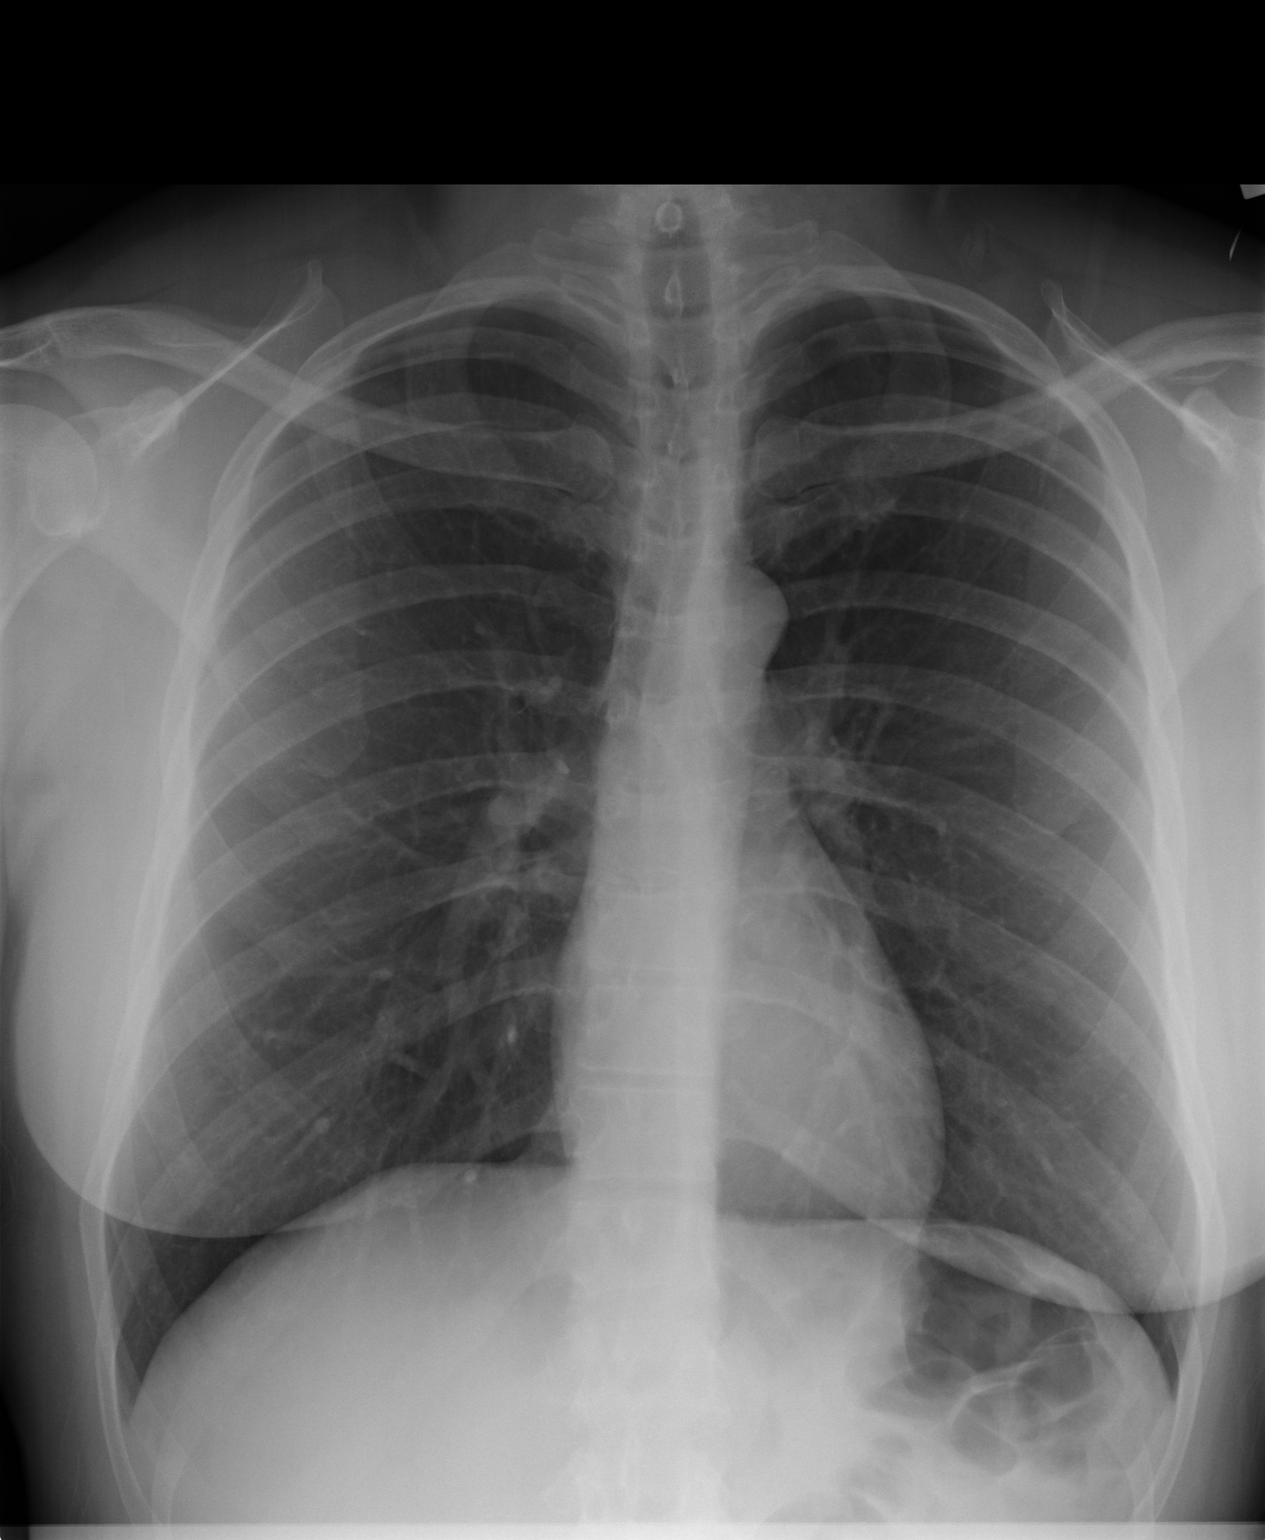

[view not recorded (2 of 2)]
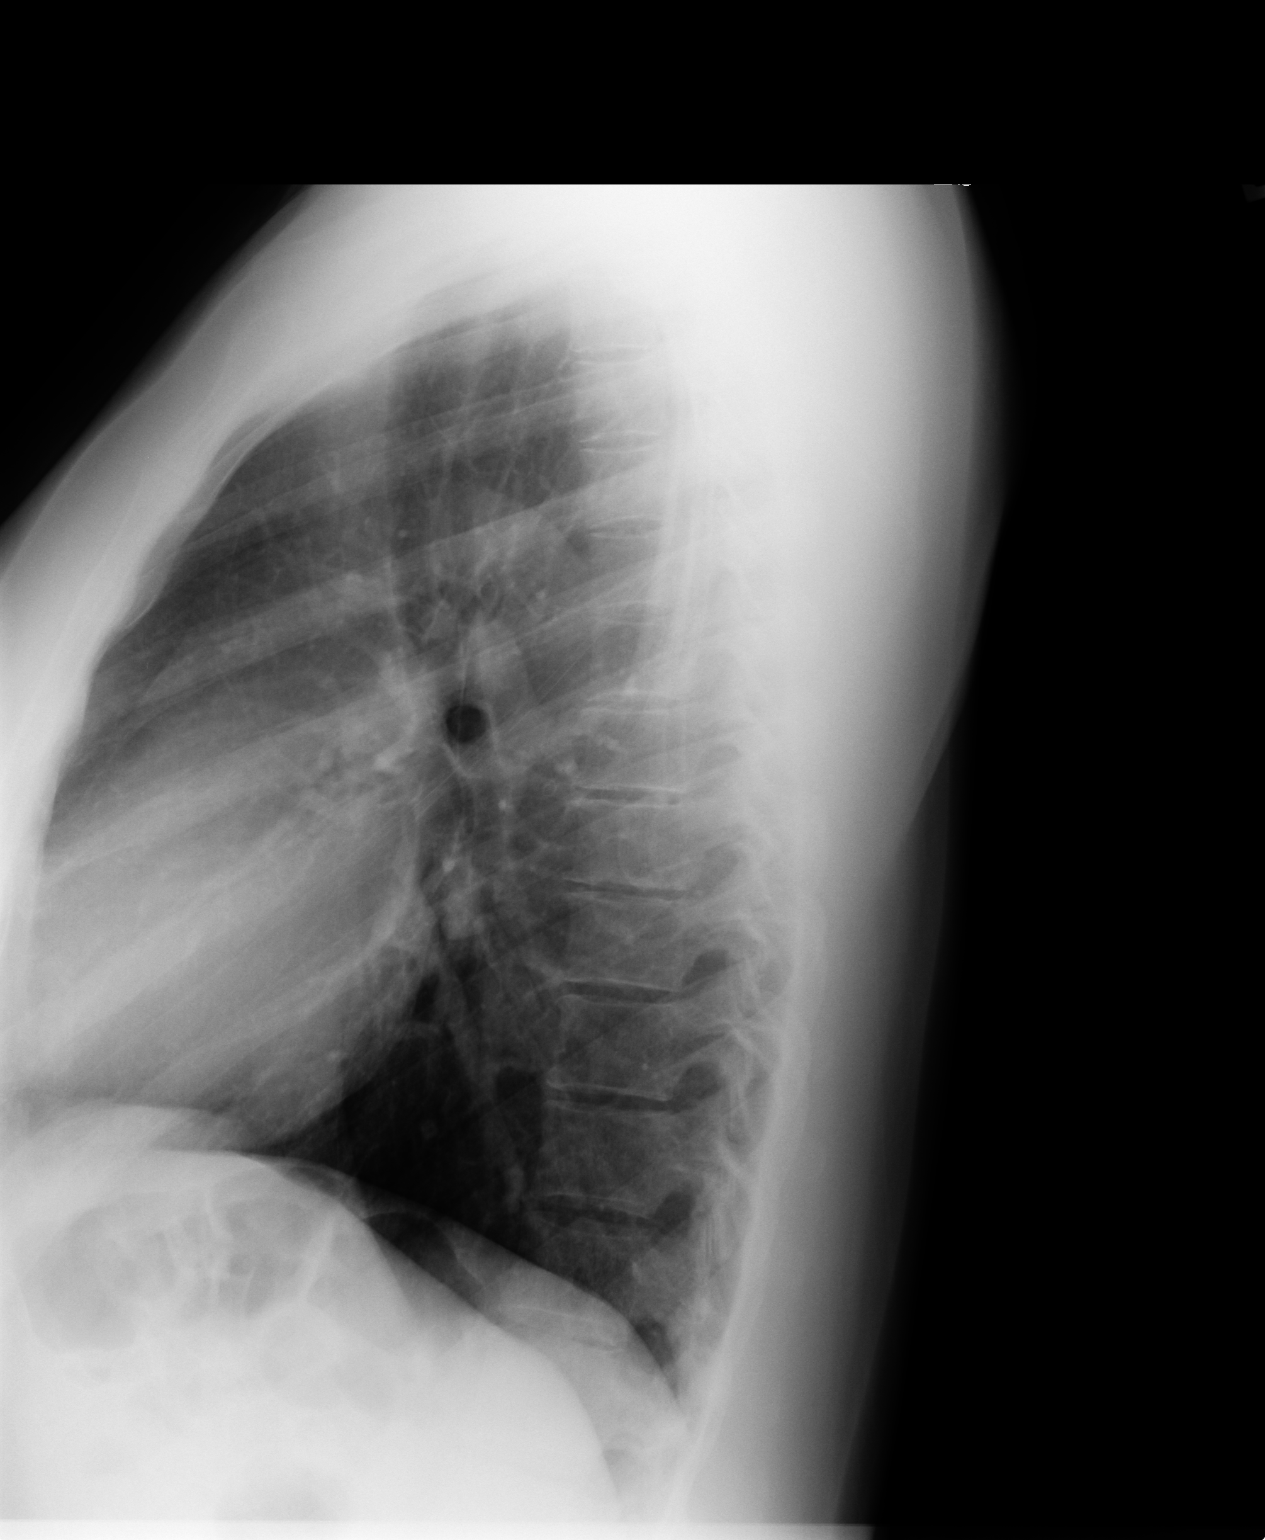

[2 of 2 positions shown; findings below may reference images not displayed]

FINDINGS: Lungs are clear.  Heart size normal.  Mediastinum, hila,
pleura and slight dextroscoliosis thoracolumbar spine junction are
otherwise unremarkable.
IMPRESSION: 1.  Minimal scoliosis.
2.  Otherwise, normal.

## 2011-05-05 ENCOUNTER — Telehealth: Payer: Self-pay | Admitting: Emergency Medicine

## 2011-05-05 NOTE — Telephone Encounter (Signed)
Patient is calling because there was a form for her insurance company that her husband brought when he had his appt in January and they have not heard back about it being completed yet.  She is asking for it to be faxed to her insurance company.

## 2011-05-05 NOTE — Telephone Encounter (Signed)
Called pt. She reports that her husband has given the form to [facility] 03-03-11. It was about pt's doctors appointments. I told the pt that I would ask Dr.Booth and call her back. Lorenda Hatchet, Renato Battles

## 2011-05-06 ENCOUNTER — Telehealth: Payer: Self-pay | Admitting: *Deleted

## 2011-05-06 NOTE — Telephone Encounter (Signed)
Called pt. Unable to reach. Will try again later. If pt calls back, please tell her: Re: form. See Dr.Booth's message. Form was given to pt's husband and if pt does not have the form Dr.Booth can fill it out again w/out pt needing OV. Lorenda Hatchet, Renato Battles

## 2011-12-25 LAB — URINALYSIS
Bilirubin (Urine): NEGATIVE
Glucose, UA: NEGATIVE
Ketones, UA: NEGATIVE
Protein, Ur: NEGATIVE
Urine-Other: 1.019

## 2011-12-25 LAB — MICROSCOPIC EXAMINATION

## 2012-08-12 ENCOUNTER — Ambulatory Visit (INDEPENDENT_AMBULATORY_CARE_PROVIDER_SITE_OTHER): Payer: 59 | Admitting: Nurse Practitioner

## 2012-08-12 ENCOUNTER — Encounter: Payer: Self-pay | Admitting: Nurse Practitioner

## 2012-08-12 VITALS — BP 100/60 | HR 82 | Temp 97.9°F | Ht 69.18 in | Wt 195.2 lb

## 2012-08-12 DIAGNOSIS — G4700 Insomnia, unspecified: Secondary | ICD-10-CM

## 2012-08-12 DIAGNOSIS — Z1321 Encounter for screening for nutritional disorder: Secondary | ICD-10-CM

## 2012-08-12 DIAGNOSIS — Z13228 Encounter for screening for other metabolic disorders: Secondary | ICD-10-CM

## 2012-08-12 DIAGNOSIS — Z1239 Encounter for other screening for malignant neoplasm of breast: Secondary | ICD-10-CM

## 2012-08-12 DIAGNOSIS — L739 Follicular disorder, unspecified: Secondary | ICD-10-CM

## 2012-08-12 DIAGNOSIS — Z130 Encounter for screening for diseases of the blood and blood-forming organs and certain disorders involving the immune mechanism: Secondary | ICD-10-CM

## 2012-08-12 DIAGNOSIS — Z0000 Encounter for general adult medical examination without abnormal findings: Secondary | ICD-10-CM

## 2012-08-12 DIAGNOSIS — L738 Other specified follicular disorders: Secondary | ICD-10-CM

## 2012-08-12 LAB — HEPATIC FUNCTION PANEL
AST: 13 U/L (ref 0–37)
Alkaline Phosphatase: 67 U/L (ref 39–117)

## 2012-08-12 LAB — CBC
HCT: 35.8 % — ABNORMAL LOW (ref 36.0–46.0)
MCV: 88.3 fl (ref 78.0–100.0)
Platelets: 272 10*3/uL (ref 150.0–400.0)
RBC: 4.06 Mil/uL (ref 3.87–5.11)
RDW: 13 % (ref 11.5–14.6)
WBC: 4.9 10*3/uL (ref 4.5–10.5)

## 2012-08-12 LAB — RENAL FUNCTION PANEL
BUN: 7 mg/dL (ref 6–23)
Glucose, Bld: 91 mg/dL (ref 70–99)
Potassium: 4.4 mEq/L (ref 3.5–5.1)
Sodium: 139 mEq/L (ref 135–145)

## 2012-08-12 MED ORDER — MUPIROCIN 2 % EX OINT: TOPICAL_OINTMENT | Freq: Three times a day (TID) | CUTANEOUS | Status: DC

## 2012-08-12 MED ORDER — ZOLPIDEM TARTRATE 10 MG PO TABS: 10.0000 mg | ORAL_TABLET | Freq: Every evening | ORAL | Status: DC | PRN

## 2012-08-12 NOTE — Patient Instructions (Addendum)
We will call you with abnormal lab results. Come back if lesion on back of leg is not getting better. Wash with soap & water before applying antibiotic cream. Cover with band aid. Remember to get your flu shot this year. Keep up the great work with exercise and healthy foods. Please ask gynecology to send [place] your notes. Pleasure to meet you!  Folliculitis  Folliculitis is redness, soreness, and swelling (inflammation) of the hair follicles. This condition can occur anywhere on the body. People with weakened immune systems, diabetes, or obesity have a greater risk of getting folliculitis. CAUSES  Bacterial infection. This is the most common cause.  Fungal infection.  Viral infection.  Contact with certain chemicals, especially oils and tars. Long-term folliculitis can result from bacteria that live in the nostrils. The bacteria may trigger multiple outbreaks of folliculitis over time. SYMPTOMS Folliculitis most commonly occurs on the scalp, thighs, legs, back, buttocks, and areas where hair is shaved frequently. An early sign of folliculitis is a small, white or yellow, pus-filled, itchy lesion (pustule). These lesions appear on a red, inflamed follicle. They are usually less than 0.2 inches (5 mm) wide. When there is an infection of the follicle that goes deeper, it becomes a boil or furuncle. A group of closely packed boils creates a larger lesion (carbuncle). Carbuncles tend to occur in hairy, sweaty areas of the body. DIAGNOSIS  Your caregiver can usually tell what is wrong by doing a physical exam. A sample may be taken from one of the lesions and tested in a lab. This can help determine what is causing your folliculitis. TREATMENT  Treatment may include:  Applying warm compresses to the affected areas.  Taking antibiotic medicines orally or applying them to the skin.  Draining the lesions if they contain a large amount of pus or fluid.  Laser hair removal for cases of long-lasting  folliculitis. This helps to prevent regrowth of the hair. HOME CARE INSTRUCTIONS  Apply warm compresses to the affected areas as directed by your caregiver.  If antibiotics are prescribed, take them as directed. Finish them even if you start to feel better.  You may take over-the-counter medicines to relieve itching.  Do not shave irritated skin.  Follow up with your caregiver as directed. SEEK IMMEDIATE MEDICAL CARE IF:   You have increasing redness, swelling, or pain in the affected area.  You have a fever. MAKE SURE YOU:  Understand these instructions.  Will watch your condition.  Will get help right away if you are not doing well or get worse. Document Released: 04/07/2001 Document Revised: 07/29/2011 Document Reviewed: 04/29/2011 [facility] Patient Information 2014 [place], [place].

## 2012-08-12 NOTE — Progress Notes (Signed)
Subjective:     Colleen Ruiz is a 47 y.o. female and is here for a comprehensive physical exam. The patient reports problems - ongoing insomnia that gets worse when her son is depolyed to [place], and an icthy lesion on the back of her R leg.Marland Kitchen  History   Social History  . Marital Status: Married    Spouse Name: N/A    Number of Children: 2  . Years of Education: N/A   Occupational History  .  [employer]   Social History Main Topics  . Smoking status: Never Smoker   . Smokeless tobacco: Never Used  . Alcohol Use: No  . Drug Use: No  . Sexually Active: Yes    Birth Control/ Protection: Surgical   Other Topics Concern  . Not on file   Social History Narrative   [name] lives with her husband, works for [employer], and has two grown children. Her son serves in the [place] [employer] and is currently serving in [place]. He will be deployed to [place] in 6 months where he has served in the past. Her daughter studied psychology at [employer] and has returned for graduate studies.    Health Maintenance  Topic Date Due  . Influenza Vaccine  10/11/2012  . Pap Smear  04/23/2013  . Tetanus/tdap  03/02/2016    The following portions of the patient's history were reviewed and updated as appropriate: allergies, current medications, past family history, past medical history, past social history, past surgical history and problem list.  Review of Systems Constitutional: negative for anorexia, chills, fatigue, fevers and night sweats Eyes: wears reading glasses Ears, nose, mouth, throat, and face: negative for hearing loss, nasal congestion, tinnitus and voice change, Hx of seasonal allergies, has not taken meds in over 1 year as symptoms have improved. Respiratory: negative for asthma, cough, dyspnea on exertion and wheezing Cardiovascular: positive for palpitations and arm pain 1 year ago when she was taking OTC diet pills-taking more than prescribed. Symptoms resolved when she  stopped the med., negative for exertional chest pressure/discomfort, fatigue and near-syncope Gastrointestinal: negative for abdominal pain, change in bowel habits, constipation and diarrhea Genitourinary:negative, has hx of frequent UTI, no recent infections in 1 year. states made some hygiene, clothing, and diet changes. Integument/breast: itchy skin lesion back of R leg, had MMG in past, normal Musculoskeletal:negative for arthralgias, muscle weakness, stiff joints and has hx of back pain, no recent flares Behavioral/Psych: positive for sleep disturbance and remote history of depression when son was depolyed to [place], negative for abusive relationship, decreased appetite, excessive alcohol consumption, fatigue, illegal drug usage, irritability and tobacco use Endocrine: negative for diabetic symptoms including polydipsia, polyphagia and polyuria and temperature intolerance Allergic/Immunologic: positive for seasonal allergies   Objective:    BP 100/60  Pulse 82  Temp(Src) 97.9 F (36.6 C) (Oral)  Ht 5' 9.18" (1.757 m)  Wt 195 lb 4 oz (88.565 kg)  BMI 28.69 kg/m2  SpO2 98%  LMP 08/09/2012 General appearance: alert, cooperative, appears stated age and no distress Head: Normocephalic, without obvious abnormality, atraumatic Eyes: negative findings: lids and lashes normal, conjunctivae and sclerae normal, corneas clear and pupils equal, round, reactive to light and accomodation Ears: normal TM's and external ear canals both ears and bilateral effusion, clear Throat: lips, mucosa, and tongue normal; teeth and gums normal Lungs: clear to auscultation bilaterally Heart: regular rate and rhythm, S1, S2 normal, no murmur, click, rub or gallop Abdomen: soft, non-tender; bowel sounds normal; no masses,  no organomegaly Extremities: extremities normal, atraumatic, no cyanosis or edema Pulses: 2+ and symmetric Skin: Skin color, texture, turgor normal. No rashes or lesions or 5mm lesion back  of r knee, scab surrounded by erythema. Lymph nodes: Cervical, supraclavicular, and axillary nodes normal.    Assessment:   1 preventive hea care-cbc, renal & hepatic func, vit d. Pap sched today w/gyn, will set up MMG, tdap up to date, slightly overweight, but down 13 pounds due to lifestyle changes 98folliculitis R leg 3insomnia r/t son's deployment to [place]   Plan:  1 will monitor labs for abnorm results. Discussed Ca intake. Reminded to get flu vaccine this fall. Continue lifestyle changes to keep weight down. 2 bactroban cream. See pt instructions 3 discussed sleep hygiene. Continue ambien-takes 2-3 nights/wk   See After Visit Summary for Counseling Recommendations

## 2012-08-17 NOTE — Addendum Note (Signed)
Addended by: Alben Spittle, Konrad Felix COX on: 08/17/2012 05:20 PM   Modules accepted: Orders

## 2012-09-01 ENCOUNTER — Encounter: Payer: Self-pay | Admitting: Nurse Practitioner

## 2012-09-03 LAB — CYTOLOGY - PAP: Pap Smear: NEGATIVE

## 2012-10-29 ENCOUNTER — Encounter: Payer: Self-pay | Admitting: Nurse Practitioner

## 2012-10-29 ENCOUNTER — Ambulatory Visit (INDEPENDENT_AMBULATORY_CARE_PROVIDER_SITE_OTHER): Payer: 59 | Admitting: Nurse Practitioner

## 2012-10-29 VITALS — BP 100/70 | HR 87 | Temp 97.8°F | Ht 69.18 in | Wt 193.5 lb

## 2012-10-29 DIAGNOSIS — G4700 Insomnia, unspecified: Secondary | ICD-10-CM

## 2012-10-29 DIAGNOSIS — Z23 Encounter for immunization: Secondary | ICD-10-CM

## 2012-10-29 DIAGNOSIS — J209 Acute bronchitis, unspecified: Secondary | ICD-10-CM

## 2012-10-29 DIAGNOSIS — R059 Cough, unspecified: Secondary | ICD-10-CM

## 2012-10-29 DIAGNOSIS — R05 Cough: Secondary | ICD-10-CM

## 2012-10-29 MED ORDER — AZITHROMYCIN 250 MG PO TABS: ORAL_TABLET | ORAL | Status: DC

## 2012-10-29 MED ORDER — METHYLPREDNISOLONE ACETATE 40 MG/ML IJ SUSP
40.0000 mg | Freq: Once | INTRAMUSCULAR | Status: AC
  Administered 2012-10-29: 40 mg via INTRAMUSCULAR

## 2012-10-29 MED ORDER — BENZONATATE 100 MG PO CAPS: ORAL_CAPSULE | ORAL | Status: DC

## 2012-10-29 MED ORDER — ZOLPIDEM TARTRATE 10 MG PO TABS: 10.0000 mg | ORAL_TABLET | Freq: Every evening | ORAL | Status: DC | PRN

## 2012-10-29 MED ORDER — PREDNISONE 10 MG PO TABS: ORAL_TABLET | ORAL | Status: DC

## 2012-10-29 NOTE — Patient Instructions (Signed)
I think your cough is caused by inflammation secondary to a viral infection. Hopefully the stroids will reduce inflammation. fIf you do not feel some relief from the cough over the weekend, start the antibiotic. You may use benzonatate (Tessalon pearles) for cough. Start sinus washes daily at allergy season. Feel better!  Bronchitis Bronchitis is the body's way of reacting to injury and/or infection (inflammation) of the bronchi. Bronchi are the air tubes that extend from the windpipe into the lungs. If the inflammation becomes severe, it may cause shortness of breath. CAUSES  Inflammation may be caused by:  A virus.  Germs (bacteria).  Dust.  Allergens.  Pollutants and many other irritants. The cells lining the bronchial tree are covered with tiny hairs (cilia). These constantly beat upward, away from the lungs, toward the mouth. This keeps the lungs free of pollutants. When these cells become too irritated and are unable to do their job, mucus begins to develop. This causes the characteristic cough of bronchitis. The cough clears the lungs when the cilia are unable to do their job. Without either of these protective mechanisms, the mucus would settle in the lungs. Then you would develop pneumonia. Smoking is a common cause of bronchitis and can contribute to pneumonia. Stopping this habit is the single most important thing you can do to help yourself. TREATMENT   Your caregiver may prescribe an antibiotic if the cough is caused by bacteria. Also, medicines that open up your airways make it easier to breathe. Your caregiver may also recommend or prescribe an expectorant. It will loosen the mucus to be coughed up. Only take over-the-counter or prescription medicines for pain, discomfort, or fever as directed by your caregiver.  Removing whatever causes the problem (smoking, for example) is critical to preventing the problem from getting worse.  Cough suppressants may be prescribed for relief  of cough symptoms.  Inhaled medicines may be prescribed to help with symptoms now and to help prevent problems from returning.  For those with recurrent (chronic) bronchitis, there may be a need for steroid medicines. SEEK IMMEDIATE MEDICAL CARE IF:   During treatment, you develop more pus-like mucus (purulent sputum).  You have a fever.  Your baby is older than 3 months with a rectal temperature of 102 F (38.9 C) or higher.  Your baby is 67 months old or younger with a rectal temperature of 100.4 F (38 C) or higher.  You become progressively more ill.  You have increased difficulty breathing, wheezing, or shortness of breath. It is necessary to seek immediate medical care if you are elderly or sick from any other disease. MAKE SURE YOU:   Understand these instructions.  Will watch your condition.  Will get help right away if you are not doing well or get worse. Document Released: 01/27/2005 Document Revised: 04/21/2011 Document Reviewed: 12/07/2007 [facility] Patient Information 2014 [place], [place].

## 2012-10-29 NOTE — Progress Notes (Signed)
Subjective:    Patient ID: Colleen Ruiz, female    DOB: 11-Jun-1965, 47 y.o.   MRN: [id number]  HPI Comments: Continues to have insomnia r/t worrying about son, deployed in [place]. Ambien is helpful. Takes 2-3 times weekly.   Cough This is a chronic problem. The current episode started 1 to 4 weeks ago (4 weeks). The problem has been unchanged. The problem occurs every few minutes. The cough is non-productive. Associated symptoms include ear congestion and a sore throat (initially had sore throat when cough 1st started, it has resolved.). Pertinent negatives include no chest pain (describes occasional chest tightness), chills, fever, headaches, nasal congestion, postnasal drip, rash, rhinorrhea, shortness of breath or wheezing. Nothing aggravates the symptoms. She has tried a beta-agonist inhaler and OTC cough suppressant (Went to urgent care, had CXR, treated for asthma-inhaler, flonase) for the symptoms. The treatment provided no relief. Her past medical history is significant for environmental allergies. There is no history of asthma. pt saw PCP for persistent cough in 2011, she does remember details of illness. No problems until recent.      Review of Systems  Constitutional: Negative for fever and chills.  HENT: Positive for sore throat (initially had sore throat when cough 1st started, it has resolved.). Negative for hearing loss, nosebleeds, congestion, rhinorrhea, neck pain and postnasal drip.   Eyes: Negative for itching.  Respiratory: Positive for cough. Negative for shortness of breath and wheezing.   Cardiovascular: Negative for chest pain (describes occasional chest tightness).  Gastrointestinal: Negative for nausea, vomiting, abdominal pain, diarrhea and abdominal distention.  Musculoskeletal: Negative for back pain.  Skin: Negative for rash.  Allergic/Immunologic: Positive for environmental allergies.  Neurological: Negative for headaches.  Hematological: Negative for  adenopathy.       Objective:   Physical Exam  Vitals reviewed. Constitutional: She is oriented to person, place, and time. She appears well-developed and well-nourished. No distress.  HENT:  Head: Normocephalic and atraumatic.  Right Ear: External ear normal.  Left Ear: External ear normal.  Mouth/Throat: Oropharynx is clear and moist. No oropharyngeal exudate.  Eyes: Conjunctivae are normal. Pupils are equal, round, and reactive to light. Right eye exhibits no discharge. Left eye exhibits no discharge.  Neck: Neck supple. No tracheal deviation present. No thyromegaly present.  Cardiovascular: Normal rate, regular rhythm and normal heart sounds.   No murmur heard. Pulmonary/Chest: Effort normal and breath sounds normal. No respiratory distress. She has no wheezes.  Musculoskeletal:  Normal gait  Lymphadenopathy:    She has no cervical adenopathy.  Neurological: She is alert and oriented to person, place, and time.  Skin: Skin is warm and dry.  Psychiatric: She has a normal mood and affect. Her behavior is normal. Thought content normal.          Assessment & Plan:  1. Insomnia, unspecified Continues to be r/t son's deployment in [place]  - zolpidem (AMBIEN) 10 MG tablet; Take 1 tablet (10 mg total) by mouth at bedtime as needed for sleep.  Dispense: 15 tablet; Refill: 5  2. Acute bronchitis Possibly viral induced asthma - methylPREDNISolone acetate (DEPO-MEDROL) injection 40 mg; Inject 1 mL (40 mg total) into the muscle once. - benzonatate (TESSALON) 100 MG capsule; Take 1-2 capsules up to 3 times daily PRN cough.  Dispense: 60 capsule; Refill: 0 - azithromycin (ZITHROMAX) 250 MG tablet; Take 2 tabs po qd on day 1, then 1T po qd on days 2-5.  Dispense: 6 tablet; Refill: 0 - predniSONE (DELTASONE) 10 MG  tablet; Starting 10/30/2012. Take 4T po qd X 1 day, then 3T po qd X 2d, then 2T po qd X 2d, then 1T po qd X 2d, then 1/2T po qd X2d, then discontinue.  Dispense: 17 tablet;  Refill: 0  3. Need for prophylactic vaccination and inoculation against influenza  - Flu Vaccine QUAD 36+ mos IM

## 2012-11-18 ENCOUNTER — Other Ambulatory Visit: Payer: Self-pay | Admitting: *Deleted

## 2012-11-18 DIAGNOSIS — J209 Acute bronchitis, unspecified: Secondary | ICD-10-CM

## 2012-11-18 NOTE — Telephone Encounter (Signed)
Refill request for benzonatate Last filled by MD on - 10/29/12 #60 x0 Last Appt: 10/29/12 Next Appt: non Please advise refill?

## 2012-11-22 ENCOUNTER — Encounter: Payer: Self-pay | Admitting: Family Medicine

## 2012-11-22 ENCOUNTER — Ambulatory Visit (INDEPENDENT_AMBULATORY_CARE_PROVIDER_SITE_OTHER)
Admission: RE | Admit: 2012-11-22 | Discharge: 2012-11-22 | Disposition: A | Discharge status: Home / Self Care | Payer: 59 | Source: Ambulatory Visit | Attending: Family Medicine | Admitting: Family Medicine

## 2012-11-22 ENCOUNTER — Ambulatory Visit (INDEPENDENT_AMBULATORY_CARE_PROVIDER_SITE_OTHER): Payer: 59 | Admitting: Family Medicine

## 2012-11-22 VITALS — BP 122/83 | HR 79 | Temp 98.2°F | Resp 18 | Ht 69.0 in | Wt 195.0 lb

## 2012-11-22 DIAGNOSIS — R059 Cough, unspecified: Secondary | ICD-10-CM

## 2012-11-22 DIAGNOSIS — R0789 Other chest pain: Secondary | ICD-10-CM

## 2012-11-22 DIAGNOSIS — R5381 Other malaise: Secondary | ICD-10-CM

## 2012-11-22 DIAGNOSIS — R05 Cough: Secondary | ICD-10-CM

## 2012-11-22 LAB — CBC WITH DIFFERENTIAL/PLATELET
Basophils Relative: 0.8 % (ref 0.0–3.0)
Eosinophils Relative: 1 % (ref 0.0–5.0)
HCT: 35.2 % — ABNORMAL LOW (ref 36.0–46.0)
Hemoglobin: 11.8 g/dL — ABNORMAL LOW (ref 12.0–15.0)
Lymphs Abs: 1.7 10*3/uL (ref 0.7–4.0)
MCV: 85.1 fl (ref 78.0–100.0)
Monocytes Absolute: 0.4 10*3/uL (ref 0.1–1.0)
RBC: 4.14 Mil/uL (ref 3.87–5.11)
WBC: 5.8 10*3/uL (ref 4.5–10.5)

## 2012-11-22 LAB — COMPREHENSIVE METABOLIC PANEL
CO2: 27 mEq/L (ref 19–32)
Creatinine, Ser: 0.7 mg/dL (ref 0.4–1.2)
GFR: 125.66 mL/min (ref >60.00)
Glucose, Bld: 85 mg/dL (ref 70–99)
Sodium: 140 mEq/L (ref 135–145)
Total Bilirubin: 0.3 mg/dL (ref 0.3–1.2)
Total Protein: 7.1 g/dL (ref 6.0–8.3)

## 2012-11-22 NOTE — Progress Notes (Signed)
OFFICE NOTE  11/22/2012  CC:  Chief Complaint  Patient presents with  . Fatigue  . Cough    tessalon pearls help  . Joint Swelling    fluid on ankles     HPI: Patient is a 47 y.o. African-American female who is here for ongoing respiratory sx's. Has had sx's now about 2 mo and all are getting better. Still with occ sens of heaviness on chest, esp when lying supine. Still with some dry cough.  Tessalon perles helps.  Prior to this she went through several OTC cough meds/cold meds.  She went through a brief oral steroid taper and a course of abx 10/29/12 when last seen here by Maximino Sarin, FNP.  Energy still not back to normal.  Still with some trouble sleeping.  No fevers.  Husband says she is hot when she sleeps but she doesn't feel it. No wheezing. Appetite is fine--"I'm not a big eater.  LMP was 8 d/a.  Menses are irregular for about the last 6-8 mo, and last 3 cycles are 7d of bleeding with some heavy days.  ROS: no melena or hematochezia.   Off/on HA over left orbit but mild and not severe.  No signif nasal congestion or mucous or PND.  Pertinent PMH:  Past Medical History  Diagnosis Date  . Anemia   . Urinary tract bacterial infections   . Seasonal allergies   . Vaginal dryness   . History of sexually transmitted disease     trich, gonorrhea, lice  . History of hematuria     MEDS:  Outpatient Prescriptions Prior to Visit  Medication Sig Dispense Refill  . benzonatate (TESSALON) 100 MG capsule Take 1-2 capsules up to 3 times daily PRN cough.  60 capsule  0  . hydrocortisone 1 % cream Apply topically 2 (two) times daily.        . Multiple Vitamin (MULTIVITAMIN) tablet Take 1 tablet by mouth daily.        Marland Kitchen zolpidem (AMBIEN) 10 MG tablet Take 1 tablet (10 mg total) by mouth at bedtime as needed for sleep.  15 tablet  5  . albuterol (PROVENTIL, VENTOLIN) (5 MG/ML) 0.5% NEBU Take by nebulization continuous.      Marland Kitchen azithromycin (ZITHROMAX) 250 MG tablet Take 2 tabs po qd  on day 1, then 1T po qd on days 2-5.  6 tablet  0  . cephALEXin (KEFLEX) 500 MG capsule Take 500 mg by mouth 3 (three) times daily. X 5 days       . cetirizine (ZYRTEC) 10 MG tablet Take 10 mg by mouth at bedtime.        . cyclobenzaprine (FLEXERIL) 10 MG tablet TAKE 1 TABLET AS NEEDED FOR BACK PAIN AT BEDTIME  25 tablet  0  . fluticasone (FLONASE) 50 MCG/ACT nasal spray Place 2 sprays into the nose daily.      . mupirocin ointment (BACTROBAN) 2 % Apply topically 3 (three) times daily.  22 g  0  . predniSONE (DELTASONE) 10 MG tablet Starting 10/30/2012. Take 4T po qd X 1 day, then 3T po qd X 2d, then 2T po qd X 2d, then 1T po qd X 2d, then 1/2T po qd X2d, then discontinue.  17 tablet  0   No facility-administered medications prior to visit.    PE: Blood pressure 122/83, pulse 79, temperature 98.2 F (36.8 C), temperature source Temporal, resp. rate 18, height 5\' 9"  (1.753 m), weight 195 lb (88.451 kg), last menstrual period  11/14/2012, SpO2 97.00%. Gen: Alert, well appearing.  Patient is oriented to person, place, time, and situation. ENT: Ears: EACs clear, normal epithelium.  TMs with good light reflex and landmarks bilaterally.  Eyes: no injection, icteris, swelling, or exudate.  EOMI, PERRLA. Nose: no drainage or turbinate edema/swelling.  No injection or focal lesion.  Mouth: lips without lesion/swelling.  Oral mucosa pink and moist.  Dentition intact and without obvious caries or gingival swelling.  Oropharynx without erythema, exudate, or swelling.  Neck - No masses or thyromegaly or limitation in range of motion CV: RRR, no m/r/g.   LUNGS: CTA bilat, nonlabored resps, good aeration in all lung fields. ABD: soft, NT/ND EXT: no clubbing, cyanosis, or edema.   LAB: CXR here today--pending   IMPRESSION AND PLAN:  Prolonged coughing illness, some residual fatigue. Suspect this is the end of a normal process.  Also possibly has some post-viral pneumonitis/RAD. Reassured pt, but will go  ahead and check a CXR and CBC w/diff, CMET, and TSH today. No new meds today.  An After Visit Summary was printed and given to the patient.  FOLLOW UP:  2 wks

## 2012-11-23 ENCOUNTER — Other Ambulatory Visit: Payer: Self-pay | Admitting: *Deleted

## 2012-11-23 DIAGNOSIS — J209 Acute bronchitis, unspecified: Secondary | ICD-10-CM

## 2012-11-23 MED ORDER — BENZONATATE 100 MG PO CAPS: ORAL_CAPSULE | ORAL | Status: DC

## 2012-11-23 NOTE — Telephone Encounter (Signed)
Refill request for tessalon Last filled by MD on - 10/26/12 #60 x0 Last Appt: 11/22/12 Next Appt: 12/06/12  Please advise refill?

## 2012-12-06 ENCOUNTER — Ambulatory Visit: Payer: 59 | Admitting: Nurse Practitioner

## 2012-12-13 ENCOUNTER — Telehealth: Payer: Self-pay | Admitting: Nurse Practitioner

## 2012-12-13 NOTE — Telephone Encounter (Signed)
LMOVM for patient to return call.

## 2012-12-22 NOTE — Telephone Encounter (Signed)
Left 2nd vm for patient to return call.

## 2012-12-24 NOTE — Telephone Encounter (Signed)
Left detailed message on patient's vm. 3rd call.

## 2013-02-09 ENCOUNTER — Other Ambulatory Visit: Payer: Self-pay | Admitting: *Deleted

## 2013-02-09 DIAGNOSIS — J209 Acute bronchitis, unspecified: Secondary | ICD-10-CM

## 2013-02-11 MED ORDER — BENZONATATE 100 MG PO CAPS: ORAL_CAPSULE | ORAL | Status: DC

## 2013-02-11 NOTE — Telephone Encounter (Signed)
Refill request for Colleen Ruiz Last filled by MD on - 11/23/12 #60 x0 Last Appt- 11/22/12 Next Appt- none Please advise refill?

## 2013-02-14 ENCOUNTER — Other Ambulatory Visit: Payer: Self-pay | Admitting: *Deleted

## 2013-02-14 DIAGNOSIS — J209 Acute bronchitis, unspecified: Secondary | ICD-10-CM

## 2013-05-09 ENCOUNTER — Telehealth: Payer: Self-pay | Admitting: Nurse Practitioner

## 2013-05-09 NOTE — Telephone Encounter (Signed)
LMOM to CB. 

## 2013-05-10 NOTE — Telephone Encounter (Signed)
Spoke with pt, she is coming in for a recheck, Vit D on Thursday.

## 2013-05-10 NOTE — Telephone Encounter (Signed)
LMOM to CB. 

## 2013-05-12 ENCOUNTER — Other Ambulatory Visit (INDEPENDENT_AMBULATORY_CARE_PROVIDER_SITE_OTHER): Payer: 59

## 2013-05-12 DIAGNOSIS — E569 Vitamin deficiency, unspecified: Secondary | ICD-10-CM

## 2013-05-13 LAB — VITAMIN D 25 HYDROXY (VIT D DEFICIENCY, FRACTURES): VIT D 25 HYDROXY: 25 ng/mL — AB (ref 30–89)

## 2013-11-10 ENCOUNTER — Ambulatory Visit (INDEPENDENT_AMBULATORY_CARE_PROVIDER_SITE_OTHER): Payer: 59 | Admitting: Nurse Practitioner

## 2013-11-10 ENCOUNTER — Encounter: Payer: Self-pay | Admitting: Nurse Practitioner

## 2013-11-10 VITALS — BP 132/86 | HR 71 | Temp 98.4°F | Ht 69.18 in | Wt 189.0 lb

## 2013-11-10 DIAGNOSIS — F418 Other specified anxiety disorders: Secondary | ICD-10-CM

## 2013-11-10 DIAGNOSIS — G4700 Insomnia, unspecified: Secondary | ICD-10-CM

## 2013-11-10 DIAGNOSIS — G43009 Migraine without aura, not intractable, without status migrainosus: Secondary | ICD-10-CM

## 2013-11-10 MED ORDER — LORAZEPAM 1 MG PO TABS: 1.0000 mg | ORAL_TABLET | Freq: Two times a day (BID) | ORAL | Status: DC | PRN

## 2013-11-10 MED ORDER — SUMATRIPTAN SUCCINATE 100 MG PO TABS: 100.0000 mg | ORAL_TABLET | Freq: Once | ORAL | Status: DC

## 2013-11-10 MED ORDER — KETOROLAC TROMETHAMINE 60 MG/2ML IM SOLN
60.0000 mg | Freq: Once | INTRAMUSCULAR | Status: AC
  Administered 2013-11-10: 60 mg via INTRAMUSCULAR

## 2013-11-10 NOTE — Progress Notes (Signed)
Pre visit review using our clinic review tool, if applicable. No additional management support is needed unless otherwise documented below in the visit note. 

## 2013-11-10 NOTE — Patient Instructions (Signed)
Go home drink 2 big glasses of water & lay down.  If still have headache in 2 hours, take 1 pill imitrex. If you still have headache 2 hours after taking 1st pill, take a second & last dose. You may repeat in 24 hours if needed.  Take ativan to help sleep & feel more calm.  DO  NOT TAKE MORE THAN 1 TABLET AMBIEN.  Please return in 2 weeks for evaluation.  Nice to see you. Feel better!

## 2013-11-11 DIAGNOSIS — G43909 Migraine, unspecified, not intractable, without status migrainosus: Secondary | ICD-10-CM | POA: Insufficient documentation

## 2013-11-11 DIAGNOSIS — F418 Other specified anxiety disorders: Secondary | ICD-10-CM | POA: Insufficient documentation

## 2013-11-11 NOTE — Assessment & Plan Note (Signed)
Never tried migraine abortive meds. Will give toradol in ofc today. Imitrex-take in 2 hrs if still has HA. Nml neuro exam. F/u 1-2 weeks.

## 2013-11-11 NOTE — Assessment & Plan Note (Signed)
Not sleeping well she states added stress last few weeks. Tearful. Took 20 mg ambien with no relief. Will try ativan Advised NEVER to take 20 mg ambien.

## 2013-11-11 NOTE — Progress Notes (Signed)
Subjective:     Colleen Ruiz is a 48 y.o. female presents for HA since yesterday unrelieved by 1500 mg tylenol. Associated symptoms: photosensitivity. This is not worst HA of life, although never had HA that she could not get to go away. Denies fever, nasal congestion, body aches, vision changes, sore throat, cough. Had HA last week that was relieved by 1000 mg tylenol & sleep. She is tearful as she expresses she is not sleeping well due to added stressors in last few weeks. She took 20 mg ambien & still could not sleep. I advised her NEVER to do that again.   The following portions of the patient's history were reviewed and updated as appropriate: allergies, current medications, past medical history, past social history, past surgical history and problem list.  Review of Systems Pertinent items are noted in HPI.    Objective:    BP 132/86  Pulse 71  Temp(Src) 98.4 F (36.9 C) (Temporal)  Ht 5' 9.18" (1.757 m)  Wt 189 lb (85.73 kg)  BMI 27.77 kg/m2  SpO2 98% BP 132/86  Pulse 71  Temp(Src) 98.4 F (36.9 C) (Temporal)  Ht 5' 9.18" (1.757 m)  Wt 189 lb (85.73 kg)  BMI 27.77 kg/m2  SpO2 98% General appearance: alert, cooperative, appears stated age and mild distress Head: Normocephalic, without obvious abnormality, atraumatic Eyes: negative findings: lids and lashes normal, conjunctivae and sclerae normal, corneas clear and pupils equal, round, reactive to light and accomodation Ears: normal TM's and external ear canals both ears Throat: lips, mucosa, and tongue normal; teeth and gums normal Lungs: clear to auscultation bilaterally Heart: regular rate and rhythm, S1, S2 normal, no murmur, click, rub or gallop Lymph nodes: no [place] or cervical LAD. Neurologic: Grossly normal    Assessment:   1. Situational anxiety - LORazepam (ATIVAN) 1 MG tablet; Take 1 tablet (1 mg total) by mouth 2 (two) times daily as needed for anxiety.  Dispense: 20 tablet; Refill: 1  2. Nonintractable  migraine, unspecified migraine type - SUMAtriptan (IMITREX) 100 MG tablet; Take 1 tablet (100 mg total) by mouth once. May repeat in 2 hours if headache persists or recurs.  Dispense: 10 tablet; Refill: 0 - ketorolac (TORADOL) injection 60 mg; Inject 2 mLs (60 mg total) into the muscle once.  3. Insomnia  See problem list for complete A&P See pt instructions. F/u 2 wks

## 2013-11-11 NOTE — Assessment & Plan Note (Signed)
Tearful. Added strssors last few weeks-did not elaborate. Not sleeping PRN ativan F/u 1-2 weeks.

## 2013-11-14 ENCOUNTER — Telehealth: Payer: Self-pay

## 2013-11-14 DIAGNOSIS — G4700 Insomnia, unspecified: Secondary | ICD-10-CM

## 2013-11-14 NOTE — Telephone Encounter (Signed)
Please advise refills 

## 2013-11-14 NOTE — Telephone Encounter (Signed)
Pt is asking about her Rx for Ambien and tessalon pearles. She states she was here to see Layne last Thursday and was supposed to get them but her pharmacy states they did not receive Rx. Please advise.

## 2013-11-15 MED ORDER — ZOLPIDEM TARTRATE 10 MG PO TABS: 10.0000 mg | ORAL_TABLET | Freq: Every evening | ORAL | Status: DC | PRN

## 2013-11-15 NOTE — Telephone Encounter (Signed)
Ambien rx sent. Patient stated that she wanted the tessalon pearles because she usually has cough this time a year. Basically she wants some on hand.

## 2013-11-15 NOTE — Telephone Encounter (Signed)
Pt did not ask me to refill these meds, perhaps she mentioned it to you? OK to refill ambien. Please call & ask why she wants tessalon pearles. She did not mention a cough.

## 2013-11-16 MED ORDER — BENZONATATE 100 MG PO CAPS: ORAL_CAPSULE | ORAL | Status: DC

## 2013-11-16 NOTE — Telephone Encounter (Signed)
Ok to refill. 60 tabs, 0 refills

## 2013-12-12 ENCOUNTER — Encounter: Payer: Self-pay | Admitting: Nurse Practitioner

## 2014-02-08 ENCOUNTER — Encounter: Payer: Self-pay | Admitting: Family Medicine

## 2014-02-08 ENCOUNTER — Ambulatory Visit (INDEPENDENT_AMBULATORY_CARE_PROVIDER_SITE_OTHER): Payer: 59 | Admitting: Family Medicine

## 2014-02-08 VITALS — BP 120/78 | HR 64 | Temp 98.2°F | Ht 69.18 in | Wt 189.0 lb

## 2014-02-08 DIAGNOSIS — G4700 Insomnia, unspecified: Secondary | ICD-10-CM

## 2014-02-08 DIAGNOSIS — F418 Other specified anxiety disorders: Secondary | ICD-10-CM

## 2014-02-08 DIAGNOSIS — N912 Amenorrhea, unspecified: Secondary | ICD-10-CM

## 2014-02-08 DIAGNOSIS — Z0000 Encounter for general adult medical examination without abnormal findings: Secondary | ICD-10-CM

## 2014-02-08 DIAGNOSIS — F4323 Adjustment disorder with mixed anxiety and depressed mood: Secondary | ICD-10-CM

## 2014-02-08 DIAGNOSIS — N926 Irregular menstruation, unspecified: Secondary | ICD-10-CM | POA: Insufficient documentation

## 2014-02-08 MED ORDER — PAROXETINE HCL 20 MG PO TABS: 20.0000 mg | ORAL_TABLET | Freq: Every day | ORAL | Status: DC

## 2014-02-08 MED ORDER — LORAZEPAM 1 MG PO TABS: 1.0000 mg | ORAL_TABLET | Freq: Two times a day (BID) | ORAL | Status: DC | PRN

## 2014-02-08 NOTE — Assessment & Plan Note (Signed)
Start paxil 20mg  qhs.  I think this will be helpful for mood/anxiety/sleep. Therapeutic expectations and side effect profile of medication discussed today.  Patient's questions answered.

## 2014-02-08 NOTE — Progress Notes (Signed)
Office Note 02/08/2014  CC:  Chief Complaint  Patient presents with  . Annual Exam    HPI:  Colleen Ruiz is a 48 y.o. Black female who is here for CPE. She has Gyn: [facility]ard, has not seen in 2 yrs or so.  No hx of abnormal paps.   No menses in 2 mo.  She is sexually active. She is fasting today.  Having more problems with depressed mood, anxiety, poor sleep lately.  No SI or HI.  Going through a separation lately, always worrying about her son in the [employer], work stress.  Has struggled with insomnia a lot, says [place] not helpful at all. Asks for increase in amount of lorazepam alotment per month and possibly a new sleep aid.    Past Medical History  Diagnosis Date  . Anemia   . Urinary tract bacterial infections   . Seasonal allergies   . Vaginal dryness   . History of sexually transmitted disease     trich, gonorrhea, lice  . History of hematuria     Past Surgical History  Procedure Laterality Date  . Cesarean section    . Tubal ligation    . Eye surgery      Lasix  . Tympanoplasty  2010    Family History  Problem Relation Age of Onset  . Hypertension Mother   . Heart disease Father   . Cancer Father     History   Social History  . Marital Status: Married    Spouse Name: N/A    Number of Children: 2  . Years of Education: N/A   Occupational History  .  [employer]   Social History Main Topics  . Smoking status: Never Smoker   . Smokeless tobacco: Never Used  . Alcohol Use: No  . Drug Use: No  . Sexual Activity: Yes    Birth Control/ Protection: Surgical   Other Topics Concern  . Not on file   Social History Narrative   Ms Walthers lives with her husband, works for [employer], and has two grown children. Her son serves in the [employer] and is currently serving in [place]. He will be deployed to [place] in 6 months where he has served in the past. Her daughter studied psychology at [employer] and has returned for graduate  studies.     Outpatient Encounter Prescriptions as of 02/08/2014  Medication Sig  . hydrocortisone 1 % cream Apply topically 2 (two) times daily.    Marland Kitchen LORazepam (ATIVAN) 1 MG tablet Take 1 tablet (1 mg total) by mouth 2 (two) times daily as needed for anxiety.  . Multiple Vitamin (MULTIVITAMIN) tablet Take 1 tablet by mouth daily.    . SUMAtriptan (IMITREX) 100 MG tablet Take 1 tablet (100 mg total) by mouth once. May repeat in 2 hours if headache persists or recurs.  . [DISCONTINUED] benzonatate (TESSALON) 100 MG capsule Take 1-2 capsules up to 3 times daily PRN cough.  . [DISCONTINUED] LORazepam (ATIVAN) 1 MG tablet Take 1 tablet (1 mg total) by mouth 2 (two) times daily as needed for anxiety.  . [DISCONTINUED] LORazepam (ATIVAN) 1 MG tablet Take 1 tablet (1 mg total) by mouth 2 (two) times daily as needed for anxiety.  . [DISCONTINUED] zolpidem (AMBIEN) 10 MG tablet Take 1 tablet (10 mg total) by mouth at bedtime as needed for sleep.  Marland Kitchen PARoxetine (PAXIL) 20 MG tablet Take 1 tablet (20 mg total) by mouth daily.    Allergies  Allergen Reactions  . Sulfonamide Derivatives     REACTION: Hives  . Penicillins Rash    ROS Review of Systems  Constitutional: Negative for fever, chills, appetite change and fatigue.  HENT: Negative for congestion, dental problem, ear pain and sore throat.   Eyes: Negative for discharge, redness and visual disturbance.  Respiratory: Negative for cough, chest tightness, shortness of breath and wheezing.   Cardiovascular: Negative for chest pain, palpitations and leg swelling.  Gastrointestinal: Negative for nausea, vomiting, abdominal pain, diarrhea and blood in stool.  Genitourinary: Negative for dysuria, urgency, frequency, hematuria, flank pain and difficulty urinating.  Musculoskeletal: Negative for myalgias, back pain, joint swelling, arthralgias and neck stiffness.  Skin: Negative for pallor and rash.  Neurological: Negative for dizziness, speech  difficulty, weakness and headaches.  Hematological: Negative for adenopathy. Does not bruise/bleed easily.  Psychiatric/Behavioral: Positive for sleep disturbance and dysphoric mood. Negative for confusion. The patient is nervous/anxious.     PE; Blood pressure 120/78, pulse 64, temperature 98.2 F (36.8 C), temperature source Temporal, height 5' 9.18" (1.757 m), weight 189 lb (85.73 kg), SpO2 98 %. Gen: Alert, well appearing.  Patient is oriented to person, place, time, and situation. AFFECT: pleasant, lucid thought and speech. ENT: Ears: EACs clear, normal epithelium.  TMs with good light reflex and landmarks bilaterally.  Eyes: no injection, icteris, swelling, or exudate.  EOMI, PERRLA. Nose: no drainage or turbinate edema/swelling.  No injection or focal lesion.  Mouth: lips without lesion/swelling.  Oral mucosa pink and moist.  Dentition intact and without obvious caries or gingival swelling.  Oropharynx without erythema, exudate, or swelling.  Neck: supple/nontender.  No LAD, mass, or TM.  Carotid pulses 2+ bilaterally, without bruits. CV: RRR, no m/r/g.   LUNGS: CTA bilat, nonlabored resps, good aeration in all lung fields. ABD: soft, NT, ND, BS normal.  No hepatospenomegaly or mass.  No bruits. EXT: no clubbing, cyanosis, or edema.  Musculoskeletal: no joint swelling, erythema, warmth, or tenderness.  ROM of all joints intact. Skin - no sores or suspicious lesions or rashes or color changes   Pertinent labs:  Lab Results  Component Value Date   WBC 5.8 11/22/2012   HGB 11.8* 11/22/2012   HCT 35.2* 11/22/2012   MCV 85.1 11/22/2012   PLT 257.0 11/22/2012   Lab Results  Component Value Date   TSH 0.95 11/22/2012     Chemistry      Component Value Date/Time   NA 140 11/22/2012 1455   K 4.0 11/22/2012 1455   CL 104 11/22/2012 1455   CO2 27 11/22/2012 1455   BUN 10 11/22/2012 1455   CREATININE 0.7 11/22/2012 1455   CREATININE 0.67 03/03/2011 1022      Component Value  Date/Time   CALCIUM 9.0 11/22/2012 1455   ALKPHOS 71 11/22/2012 1455   AST 14 11/22/2012 1455   ALT 17 11/22/2012 1455   BILITOT 0.3 11/22/2012 1455     Lab Results  Component Value Date   CHOL 171 03/03/2011   HDL 55 03/03/2011   LDLCALC 104* 03/03/2011   TRIG 60 03/03/2011   CHOLHDL 3.1 03/03/2011   UPT NEGATIVE today.  ASSESSMENT AND PLAN:   Adjustment disorder with mixed anxiety and depressed mood Start paxil 20mg  qhs.  I think this will be helpful for mood/anxiety/sleep. Therapeutic expectations and side effect profile of medication discussed today.  Patient's questions answered.   Insomnia Ambien not helpful, so will d/c this. Started paxil 20mg  qd and will have her take  a lorazepam 1mg  qhs. She'll call with sleep report in 1 wk and if still having excessive problems initiating/maintaining sleep will add new sleep aid (the only one she has tried is [place]).    Health maintenance examination Reviewed age and gender appropriate health maintenance issues (prudent diet, regular exercise, health risks of tobacco and excessive alcohol, use of seatbelts, fire alarms in home, use of sunscreen).  Also reviewed age and gender appropriate health screening as well as vaccine recommendations. HP labs today (fasting).   She is reminded today to make f/u with her GYN in the next year or so for cervical and breast ca screening.  Amenorrhea UPT neg. Stress is no doubt playing a role in her cycles the last year or so but I also think she is entering perimenopausal state.   An After Visit Summary was printed and given to the patient.  Return in about 4 weeks (around 03/08/2014) for f/u mood/anx/insomnia.

## 2014-02-08 NOTE — Progress Notes (Signed)
Pre visit review using our clinic review tool, if applicable. No additional management support is needed unless otherwise documented below in the visit note. 

## 2014-02-08 NOTE — Assessment & Plan Note (Signed)
UPT neg. Stress is no doubt playing a role in her cycles the last year or so but I also think she is entering perimenopausal state.

## 2014-02-08 NOTE — Assessment & Plan Note (Signed)
Ambien not helpful, so will d/c this. Started paxil 20mg  qd and will have her take a lorazepam 1mg  qhs. She'll call with sleep report in 1 wk and if still having excessive problems initiating/maintaining sleep will add new sleep aid (the only one she has tried is [place]).

## 2014-02-08 NOTE — Assessment & Plan Note (Signed)
Reviewed age and gender appropriate health maintenance issues (prudent diet, regular exercise, health risks of tobacco and excessive alcohol, use of seatbelts, fire alarms in home, use of sunscreen).  Also reviewed age and gender appropriate health screening as well as vaccine recommendations. HP labs today (fasting).   She is reminded today to make f/u with her GYN in the next year or so for cervical and breast ca screening.

## 2014-02-09 LAB — CBC WITH DIFFERENTIAL/PLATELET
BASOS ABS: 0.1 10*3/uL (ref 0.0–0.1)
BASOS PCT: 1 % (ref 0–1)
EOS ABS: 0.1 10*3/uL (ref 0.0–0.7)
Eosinophils Relative: 1 % (ref 0–5)
HEMATOCRIT: 35 % — AB (ref 36.0–46.0)
HEMOGLOBIN: 11.9 g/dL — AB (ref 12.0–15.0)
Lymphocytes Relative: 34 % (ref 12–46)
Lymphs Abs: 1.8 10*3/uL (ref 0.7–4.0)
MCH: 28 pg (ref 26.0–34.0)
MCHC: 34 g/dL (ref 30.0–36.0)
MCV: 82.4 fL (ref 78.0–100.0)
MONOS PCT: 8 % (ref 3–12)
MPV: 11.5 fL (ref 8.6–12.4)
Monocytes Absolute: 0.4 10*3/uL (ref 0.1–1.0)
NEUTROS PCT: 56 % (ref 43–77)
Neutro Abs: 3 10*3/uL (ref 1.7–7.7)
Platelets: 250 10*3/uL (ref 150–400)
RBC: 4.25 MIL/uL (ref 3.87–5.11)
RDW: 13.2 % (ref 11.5–15.5)
WBC: 5.4 10*3/uL (ref 4.0–10.5)

## 2014-02-09 LAB — COMPREHENSIVE METABOLIC PANEL
ALT: <8 U/L (ref 0–35)
AST: 10 U/L (ref 0–37)
Albumin: 4 g/dL (ref 3.5–5.2)
Alkaline Phosphatase: 64 U/L (ref 39–117)
BILIRUBIN TOTAL: 0.5 mg/dL (ref 0.2–1.2)
BUN: 10 mg/dL (ref 6–23)
CHLORIDE: 106 meq/L (ref 96–112)
CO2: 26 meq/L (ref 19–32)
CREATININE: 0.74 mg/dL (ref 0.50–1.10)
Calcium: 9 mg/dL (ref 8.4–10.5)
GLUCOSE: 84 mg/dL (ref 70–99)
Potassium: 3.6 mEq/L (ref 3.5–5.3)
Sodium: 139 mEq/L (ref 135–145)
TOTAL PROTEIN: 7.3 g/dL (ref 6.0–8.3)

## 2014-02-09 LAB — LIPID PANEL
CHOL/HDL RATIO: 3.3 ratio
CHOLESTEROL: 192 mg/dL (ref 0–200)
HDL: 59 mg/dL (ref >39)
LDL Cholesterol: 121 mg/dL — ABNORMAL HIGH (ref 0–99)
TRIGLYCERIDES: 60 mg/dL (ref <150)
VLDL: 12 mg/dL (ref 0–40)

## 2014-02-09 NOTE — Addendum Note (Signed)
Addended by: Cydney OkAUGUSTIN, Lleyton Byers N on: 02/09/2014 09:57 AM   Modules accepted: Orders

## 2014-02-10 LAB — TSH: TSH: 0.507 u[IU]/mL (ref 0.350–4.500)

## 2014-02-13 ENCOUNTER — Telehealth: Payer: Self-pay | Admitting: Nurse Practitioner

## 2014-02-13 MED ORDER — TRAZODONE HCL 50 MG PO TABS: ORAL_TABLET | ORAL | Status: DC

## 2014-02-13 NOTE — Telephone Encounter (Signed)
Patient states that during her office visit Dr. Milinda Cave asked her to CB if she wanted to try "the other pill". Patient would like an Rx.

## 2014-02-13 NOTE — Telephone Encounter (Signed)
OK. Trazodone rx sent to pharmacy.

## 2014-02-13 NOTE — Telephone Encounter (Signed)
Do you know what the "other pill" is or do I need to contact patient.  Please advise.

## 2014-03-08 ENCOUNTER — Ambulatory Visit: Payer: 59 | Admitting: Family Medicine

## 2014-05-16 ENCOUNTER — Ambulatory Visit (INDEPENDENT_AMBULATORY_CARE_PROVIDER_SITE_OTHER): Payer: 59 | Admitting: Nurse Practitioner

## 2014-05-16 ENCOUNTER — Ambulatory Visit: Payer: Self-pay | Admitting: Nurse Practitioner

## 2014-05-16 ENCOUNTER — Encounter: Payer: Self-pay | Admitting: Nurse Practitioner

## 2014-05-16 VITALS — BP 112/79 | HR 65 | Temp 98.1°F | Ht 69.0 in | Wt 188.0 lb

## 2014-05-16 DIAGNOSIS — W1800XA Striking against unspecified object with subsequent fall, initial encounter: Secondary | ICD-10-CM | POA: Insufficient documentation

## 2014-05-16 DIAGNOSIS — W1809XA Striking against other object with subsequent fall, initial encounter: Secondary | ICD-10-CM | POA: Diagnosis not present

## 2014-05-16 DIAGNOSIS — R42 Dizziness and giddiness: Secondary | ICD-10-CM

## 2014-05-16 DIAGNOSIS — H538 Other visual disturbances: Secondary | ICD-10-CM | POA: Diagnosis not present

## 2014-05-16 NOTE — Progress Notes (Signed)
Pre visit review using our clinic review tool, if applicable. No additional management support is needed unless otherwise documented below in the visit note. 

## 2014-05-16 NOTE — Patient Instructions (Signed)
Please rest for next several days. Take tylenol for pain: 1000 mg eery 8 hours if needed. No aspirin, ibuprophen, aleve, goody's powders, excedrin, motrin, advil is these medicines can increase risk for bleeding. No meds that decrease level of consciousness for 3 days: muscle relaxer, narcotics, alcoholic beverages. You may use ice followed by heat on neck & back several times today, then heat only. Please get some sleep, eat. Let me know if you are still feeling nauseous or have double vision, or headache pain gets worse.  Please see [place] in 10 days.   Concussion A concussion, or closed-head injury, is a brain injury caused by a direct blow to the head or by a quick and sudden movement (jolt) of the head or neck. Concussions are usually not life-threatening. Even so, the effects of a concussion can be serious. If you have had a concussion before, you are more likely to experience concussion-like symptoms after a direct blow to the head.  CAUSES  Direct blow to the head, such as from running into another player during a soccer game, being hit in a fight, or hitting your head on a hard surface.  A jolt of the head or neck that causes the brain to move back and forth inside the skull, such as in a car crash. SIGNS AND SYMPTOMS The signs of a concussion can be hard to notice. Early on, they may be missed by you, family members, and health care providers. You may look fine but act or feel differently. Symptoms are usually temporary, but they may last for days, weeks, or even longer. Some symptoms may appear right away while others may not show up for hours or days. Every head injury is different. Symptoms include:  Mild to moderate headaches that will not go away.  A feeling of pressure inside your head.  Having more trouble than usual:  Learning or remembering things you have heard.  Answering questions.  Paying attention or concentrating.  Organizing daily tasks.  Making decisions and  solving problems.  Slowness in thinking, acting or reacting, speaking, or reading.  Getting lost or being easily confused.  Feeling tired all the time or lacking energy (fatigued).  Feeling drowsy.  Sleep disturbances.  Sleeping more than usual.  Sleeping less than usual.  Trouble falling asleep.  Trouble sleeping (insomnia).  Loss of balance or feeling lightheaded or dizzy.  Nausea or vomiting.  Numbness or tingling.  Increased sensitivity to:  Sounds.  Lights.  Distractions.  Vision problems or eyes that tire easily.  Diminished sense of taste or smell.  Ringing in the ears.  Mood changes such as feeling sad or anxious.  Becoming easily irritated or angry for little or no reason.  Lack of motivation.  Seeing or hearing things other people do not see or hear (hallucinations). DIAGNOSIS Your health care provider can usually diagnose a concussion based on a description of your injury and symptoms. He or she will ask whether you passed out (lost consciousness) and whether you are having trouble remembering events that happened right before and during your injury. Your evaluation might include:  A brain scan to look for signs of injury to the brain. Even if the test shows no injury, you may still have a concussion.  Blood tests to be sure other problems are not present. TREATMENT  Concussions are usually treated in an emergency department, in urgent care, or at a clinic. You may need to stay in the hospital overnight for further treatment.  Tell your health care provider if you are taking any medicines, including prescription medicines, over-the-counter medicines, and natural remedies. Some medicines, such as blood thinners (anticoagulants) and aspirin, may increase the chance of complications. Also tell your health care provider whether you have had alcohol or are taking illegal drugs. This information may affect treatment.  Your health care provider will  send you home with important instructions to follow.  How fast you will recover from a concussion depends on many factors. These factors include how severe your concussion is, what part of your brain was injured, your age, and how healthy you were before the concussion.  Most people with mild injuries recover fully. Recovery can take time. In general, recovery is slower in older persons. Also, persons who have had a concussion in the past or have other medical problems may find that it takes longer to recover from their current injury. HOME CARE INSTRUCTIONS General Instructions  Carefully follow the directions your health care provider gave you.  Only take over-the-counter or prescription medicines for pain, discomfort, or fever as directed by your health care provider.  Take only those medicines that your health care provider has approved.  Do not drink alcohol until your health care provider says you are well enough to do so. Alcohol and certain other drugs may slow your recovery and can put you at risk of further injury.  If it is harder than usual to remember things, write them down.  If you are easily distracted, try to do one thing at a time. For example, do not try to watch TV while fixing dinner.  Talk with family members or close friends when making important decisions.  Keep all follow-up appointments. Repeated evaluation of your symptoms is recommended for your recovery.  Watch your symptoms and tell others to do the same. Complications sometimes occur after a concussion. Older adults with a brain injury may have a higher risk of serious complications, such as a blood clot on the brain.  Tell your teachers, school nurse, school counselor, coach, athletic trainer, or work Production designer, theatre/television/film about your injury, symptoms, and restrictions. Tell them about what you can or cannot do. They should watch for:  Increased problems with attention or concentration.  Increased difficulty remembering  or learning new information.  Increased time needed to complete tasks or assignments.  Increased irritability or decreased ability to cope with stress.  Increased symptoms.  Rest. Rest helps the brain to heal. Make sure you:  Get plenty of sleep at night. Avoid staying up late at night.  Keep the same bedtime hours on weekends and weekdays.  Rest during the day. Take daytime naps or rest breaks when you feel tired.  Limit activities that require a lot of thought or concentration. These include:  Doing homework or job-related work.  Watching TV.  Working on the computer.  Avoid any situation where there is potential for another head injury (football, hockey, soccer, basketball, martial arts, downhill snow sports and horseback riding). Your condition will get worse every time you experience a concussion. You should avoid these activities until you are evaluated by the appropriate follow-up health care providers. Returning To Your Regular Activities You will need to return to your normal activities slowly, not all at once. You must give your body and brain enough time for recovery.  Do not return to sports or other athletic activities until your health care provider tells you it is safe to do so.  Ask your health care provider when  you can drive, ride a bicycle, or operate heavy machinery. Your ability to react may be slower after a brain injury. Never do these activities if you are dizzy.  Ask your health care provider about when you can return to work or school. Preventing Another Concussion It is very important to avoid another brain injury, especially before you have recovered. In rare cases, another injury can lead to permanent brain damage, brain swelling, or death. The risk of this is greatest during the first 7-10 days after a head injury. Avoid injuries by:  Wearing a seat belt when riding in a car.  Drinking alcohol only in moderation.  Wearing a helmet when biking,  skiing, skateboarding, skating, or doing similar activities.  Avoiding activities that could lead to a second concussion, such as contact or recreational sports, until your health care provider says it is okay.  Taking safety measures in your home.  Remove clutter and tripping hazards from floors and stairways.  Use grab bars in bathrooms and handrails by stairs.  Place non-slip mats on floors and in bathtubs.  Improve lighting in dim areas. SEEK MEDICAL CARE IF:  You have increased problems paying attention or concentrating.  You have increased difficulty remembering or learning new information.  You need more time to complete tasks or assignments than before.  You have increased irritability or decreased ability to cope with stress.  You have more symptoms than before. Seek medical care if you have any of the following symptoms for more than 2 weeks after your injury:  Lasting (chronic) headaches.  Dizziness or balance problems.  Nausea.  Vision problems.  Increased sensitivity to noise or light.  Depression or mood swings.  Anxiety or irritability.  Memory problems.  Difficulty concentrating or paying attention.  Sleep problems.  Feeling tired all the time. SEEK IMMEDIATE MEDICAL CARE IF:  You have severe or worsening headaches. These may be a sign of a blood clot in the brain.  You have weakness (even if only in one hand, leg, or part of the face).  You have numbness.  You have decreased coordination.  You vomit repeatedly.  You have increased sleepiness.  One pupil is larger than the other.  You have convulsions.  You have slurred speech.  You have increased confusion. This may be a sign of a blood clot in the brain.  You have increased restlessness, agitation, or irritability.  You are unable to recognize people or places.  You have neck pain.  It is difficult to wake you up.  You have unusual behavior changes.  You lose  consciousness. MAKE SURE YOU:  Understand these instructions.  Will watch your condition.  Will get help right away if you are not doing well or get worse. Document Released: 04/19/2003 Document Revised: 02/01/2013 Document Reviewed: 08/19/2012 [address] [facility] Patient Information 2015 [place] [place]. This information is not intended to replace advice given to you by your health care provider. Make sure you discuss any questions you have with your health care provider.

## 2014-05-16 NOTE — Progress Notes (Signed)
Subjective:    Colleen Ruiz is a 49 y.o. female who presents for evaluation of a possible concussion. She is accompanied by husband. Initial evaluation is this visit. Injury occurred 14 hours ago while getting into shower. Mechanism of injury was head to back of tub contact. The point of impact was the occiput. Patient did experience an altered level of consciousness- no loc, but husband describes her initial response as "dazed" for about 10 minutes. Patient did not have amnesia. Initially after injury she was nauseous, vomited twice. Slight nausea this morning. Tried to stay awake all night. Feels "lightheaded this morning. Denies double vision, but reports vision seems blurred at times. C/o L-sided neck pain that radiates into arm & lower back pain. She took 500 mg tylenol this morning. She has had no previous head injuries.   The following portions of the patient's history were reviewed and updated as appropriate: allergies, current medications, past medical history, past social history, past surgical history and problem list.  Review of Systems Eyes: positive for visual disturbance Respiratory: negative for cough Musculoskeletal:positive for back pain and neck pain Neurological: positive for dizziness and headaches, negative for coordination problems, memory problems, paresthesia, vertigo and weakness    Objective:    BP 112/79 mmHg  Pulse 65  Temp(Src) 98.1 F (36.7 C) (Oral)  Ht 5\' 9"  (1.753 m)  Wt 188 lb (85.276 kg)  BMI 27.75 kg/m2  SpO2 99%  LMP 04/22/2014 (Approximate) General appearance: alert, cooperative, appears stated age, mild distress and tired & teary Head: Normocephalic, without obvious abnormality, no lac, tender area at back of head, no swollen area palpated Eyes: negative findings: lids and lashes normal, conjunctivae and sclerae normal, corneas clear, pupils equal, round, reactive to light and accomodation and visual fields full to confrontation Ears: normal TM's and  external ear canals both ears Neck: spinal tenderness & adjacent tenderness bilat, L superior trap tender. Painful rotation to L. Back: no obvious deformity, no bruises. bilat tenderness adjacent to lumbar spine. Lungs: clear to auscultation bilaterally Heart: regular rate and rhythm, S1, S2 normal, no murmur, click, rub or gallop Skin: no bruising neck or back Neurologic: Mental status: Alert, oriented, thought content appropriate Cranial nerves: normal Motor: grossly normal Coordination: finger to nose normal bilaterally Gait: Normal    Assessment:   1. Fall against object, initial encounter Head injury Neck & back pain No red flags Pt & husband understand to call if feels nauseous, HA worsens, double vision Understands to take no meds that will increase risk for bleeding or decrease LOC. F/u 10 days

## 2014-05-22 ENCOUNTER — Other Ambulatory Visit: Payer: Self-pay | Admitting: *Deleted

## 2014-05-22 ENCOUNTER — Telehealth: Payer: Self-pay | Admitting: Nurse Practitioner

## 2014-05-22 MED ORDER — TRAZODONE HCL 50 MG PO TABS: ORAL_TABLET | ORAL | Status: DC

## 2014-05-22 NOTE — Telephone Encounter (Signed)
LMOVM informing patient not to take Trazodone and Ambien together. DPR Signed. Told patient to call back with questions

## 2014-05-22 NOTE — Telephone Encounter (Signed)
Refill request for trazodone Last filled by MD on- 02/13/14 #60 x1 Last Appt: 05/16/2014 Next Appt: 05/26/2014 Please advise refill?

## 2014-05-22 NOTE — Telephone Encounter (Signed)
Trazodone sent to pharm. Please tell pt not to take ambien & trazodone together.

## 2014-05-26 ENCOUNTER — Ambulatory Visit: Payer: 59 | Admitting: Nurse Practitioner

## 2014-06-01 ENCOUNTER — Telehealth: Payer: Self-pay | Admitting: Family Medicine

## 2014-06-01 NOTE — Telephone Encounter (Signed)
LMOVM asking patient to call back to schedule appt for OV for sleep meds. DPR Signed.

## 2014-06-01 NOTE — Telephone Encounter (Signed)
Rf request for zolpidem.  Last OV was 05/16/14.  I do not see this medication in patients chart.  Please advise.

## 2014-06-01 NOTE — Telephone Encounter (Signed)
Pt needs to come in for OV to discuss sleep: she saw Dr Drema BalzarineMcg in[place] because [place] wasn't working. He has prescribed 3 different meds since then-prozac, ativan, and trazodone.

## 2014-06-05 ENCOUNTER — Other Ambulatory Visit: Payer: Self-pay | Admitting: Family Medicine

## 2014-06-05 NOTE — Telephone Encounter (Signed)
Additional request from pharmacy for [place].  Will let the pharmacy know that pt is not to take ambien with trazadone.   Looks like patient should be aware.

## 2014-06-07 ENCOUNTER — Other Ambulatory Visit: Payer: Self-pay

## 2014-06-07 DIAGNOSIS — Z1231 Encounter for screening mammogram for malignant neoplasm of breast: Secondary | ICD-10-CM

## 2014-06-26 ENCOUNTER — Other Ambulatory Visit: Payer: Self-pay

## 2014-06-26 DIAGNOSIS — G4700 Insomnia, unspecified: Secondary | ICD-10-CM

## 2014-06-26 NOTE — Telephone Encounter (Signed)
Please Advise Refill Request? Refill request for- Trazadone Last filled by MD on - 05/22/14 Last Appt - 05/16/14        Next Appt - none scheduled Pharmacy- [facility]

## 2014-06-27 MED ORDER — TRAZODONE HCL 50 MG PO TABS
ORAL_TABLET | ORAL | Status: DC
Start: 2014-06-27 — End: 2014-08-15

## 2014-06-27 NOTE — Telephone Encounter (Addendum)
LMOVM informing patient that we would not send in any more refills of Trazadone unless she has an appointment and to CB at her convenience to make that appt.

## 2014-06-27 NOTE — Telephone Encounter (Signed)
pls see prev notes about sleep meds. She needs appt. I will send 30 d of trazodone, but will not refill unless she has appt. Pls call her & notify her.

## 2014-06-30 ENCOUNTER — Ambulatory Visit: Payer: 59

## 2014-08-15 ENCOUNTER — Ambulatory Visit (INDEPENDENT_AMBULATORY_CARE_PROVIDER_SITE_OTHER): Payer: 59 | Admitting: Nurse Practitioner

## 2014-08-15 ENCOUNTER — Encounter: Payer: Self-pay | Admitting: Nurse Practitioner

## 2014-08-15 VITALS — BP 101/68 | HR 64 | Temp 98.2°F | Resp 16 | Ht 69.5 in | Wt 185.0 lb

## 2014-08-15 DIAGNOSIS — F418 Other specified anxiety disorders: Secondary | ICD-10-CM | POA: Diagnosis not present

## 2014-08-15 DIAGNOSIS — G4700 Insomnia, unspecified: Secondary | ICD-10-CM | POA: Diagnosis not present

## 2014-08-15 DIAGNOSIS — D649 Anemia, unspecified: Secondary | ICD-10-CM

## 2014-08-15 DIAGNOSIS — Z0000 Encounter for general adult medical examination without abnormal findings: Secondary | ICD-10-CM | POA: Diagnosis not present

## 2014-08-15 DIAGNOSIS — Z114 Encounter for screening for human immunodeficiency virus [HIV]: Secondary | ICD-10-CM

## 2014-08-15 LAB — CBC
HCT: 38 % (ref 36.0–46.0)
Hemoglobin: 12.4 g/dL (ref 12.0–15.0)
MCHC: 32.6 g/dL (ref 30.0–36.0)
MCV: 85.8 fl (ref 78.0–100.0)
PLATELETS: 266 10*3/uL (ref 150.0–400.0)
RBC: 4.43 Mil/uL (ref 3.87–5.11)
RDW: 12.9 % (ref 11.5–15.5)
WBC: 4 10*3/uL (ref 4.0–10.5)

## 2014-08-15 LAB — LIPID PANEL
CHOL/HDL RATIO: 3
CHOLESTEROL: 174 mg/dL (ref 0–200)
HDL: 52 mg/dL (ref >39.00)
LDL Cholesterol: 111 mg/dL — ABNORMAL HIGH (ref 0–99)
NonHDL: 122
Triglycerides: 55 mg/dL (ref 0.0–149.0)
VLDL: 11 mg/dL (ref 0.0–40.0)

## 2014-08-15 LAB — COMPREHENSIVE METABOLIC PANEL
ALK PHOS: 68 U/L (ref 39–117)
ALT: 25 U/L (ref 0–35)
AST: 20 U/L (ref 0–37)
Albumin: 4.1 g/dL (ref 3.5–5.2)
BILIRUBIN TOTAL: 0.4 mg/dL (ref 0.2–1.2)
BUN: 21 mg/dL (ref 6–23)
CO2: 29 mEq/L (ref 19–32)
CREATININE: 0.75 mg/dL (ref 0.40–1.20)
Calcium: 9.5 mg/dL (ref 8.4–10.5)
Chloride: 105 mEq/L (ref 96–112)
GFR: 105.75 mL/min (ref >60.00)
GLUCOSE: 80 mg/dL (ref 70–99)
Potassium: 4.9 mEq/L (ref 3.5–5.1)
Sodium: 141 mEq/L (ref 135–145)
Total Protein: 7.7 g/dL (ref 6.0–8.3)

## 2014-08-15 LAB — IRON AND TIBC
%SAT: 31 % (ref 20–55)
IRON: 89 ug/dL (ref 42–145)
TIBC: 288 ug/dL (ref 250–470)
UIBC: 199 ug/dL (ref 125–400)

## 2014-08-15 LAB — VITAMIN D 25 HYDROXY (VIT D DEFICIENCY, FRACTURES): VITD: 19.28 ng/mL — AB (ref 30.00–100.00)

## 2014-08-15 LAB — HEMOGLOBIN A1C: Hgb A1c MFr Bld: 5.4 % (ref 4.6–6.5)

## 2014-08-15 LAB — HIV ANTIBODY (ROUTINE TESTING W REFLEX): HIV 1&2 Ab, 4th Generation: NONREACTIVE

## 2014-08-15 MED ORDER — LORAZEPAM 1 MG PO TABS: 1.0000 mg | ORAL_TABLET | Freq: Every day | ORAL | Status: DC | PRN

## 2014-08-15 MED ORDER — ZOLPIDEM TARTRATE 5 MG PO TABS: 5.0000 mg | ORAL_TABLET | Freq: Every evening | ORAL | Status: DC | PRN

## 2014-08-15 NOTE — Progress Notes (Signed)
Subjective:     Colleen Ruiz is a 49 y.o. female and is here for a comprehensive physical exam. The patient reports ongoing struggle with not sleeping well.  Several months ago, Colleen Ruiz was d/c'd & trazodone, lorazepam, & paxil were started. Pt stopped paxil after few days due to "not working". She did not realize onset of action is several weeks. Ttazodone did not help her. She was taking lorazepam twice daily until about 1 month ago-she stopped due to not feeling it helped with sleep. She is exercising, changed dietary habits-more fruits & vegetables, less soda and is feeling better. She is seeing therapist to "work through issues". We discussed starting daily treatment for GAD, she does not want to do this-she wants to see if recent lifestyle changes & therapy improve QOL, sleep, and result in fewer "panic episodes" She reports 3 panic attacks in last few months described as heart racing, feeling SOB. Historcally, she reports Ambien works best for sleep.   She plans to see gynecology this year for PAP-last was 2013-nml. She plans to schedule MMG this yr.   History   Social History  . Marital Status: Married    Spouse Name: N/A  . Number of Children: 2  . Years of Education: N/A   Occupational History  .  [employer]   Social History Main Topics  . Smoking status: Never Smoker   . Smokeless tobacco: Never Used  . Alcohol Use: No  . Drug Use: No  . Sexual Activity: Yes    Birth Control/ Protection: Surgical   Other Topics Concern  . Not on file   Social History Narrative   [name] lives with her husband, works for [employer], and has two grown children. Her son serves in the [place] [employer] and is currently serving in [place]. He will be deployed to [place] in 6 months where he has served in the past. Her daughter studied psychology at [employer] and has returned for graduate studies.    Health Maintenance  Topic Date Due  . HIV Screening  11/28/1980  . PAP SMEAR  03/31/2014   . INFLUENZA VACCINE  09/11/2014  . TETANUS/TDAP  03/02/2016    The following portions of the patient's history were reviewed and updated as appropriate: allergies, current medications, past family history, past medical history, past social history, past surgical history and problem list.  Review of Systems Constitutional: negative for fatigue and fevers Respiratory: negative for cough Cardiovascular: negative for lower extremity edema and palpitations Gastrointestinal: negative for constipation, diarrhea, dyspepsia and more frequent BM since adding plant foods & water to diet Genitourinary:positive for abnormal menstrual periods and last 3 mos MC is regulr, but she has missed several cycles over last year. C/o vag dryness. Plans to speak w/gyn about these matters.  Integument/breast: negative for rash and skin lesion(s) Neurological: fewer HA since cut back soda.   Objective:    BP 101/68 mmHg  Pulse 64  Temp(Src) 98.2 F (36.8 C) (Temporal)  Resp 16  Ht 5' 9.5" (1.765 m)  Wt 185 lb (83.915 kg)  BMI 26.94 kg/m2  SpO2 98% General appearance: alert, cooperative, appears stated age and no distress Head: Normocephalic, without obvious abnormality, atraumatic Eyes: negative findings: lids and lashes normal, conjunctivae and sclerae normal, corneas clear and pupils equal, round, reactive to light and accomodation Ears: normal TM's and external ear canals both ears Throat: lips, mucosa, and tongue normal; teeth and gums normal Neck: no adenopathy, no carotid bruit, supple, symmetrical, trachea  midline and thyroid not enlarged, symmetric, no tenderness/mass/nodules Lungs: clear to auscultation bilaterally Heart: regular rate and rhythm, S1, S2 normal, no murmur, click, rub or gallop Abdomen: soft, non-tender; bowel sounds normal; no masses,  no organomegaly Extremities: extremities normal, atraumatic, no cyanosis or edema Skin: Skin color, texture, turgor normal. No rashes or  lesions Lymph nodes: Cervical adenopathy: none and Supraclavicular adenopathy: none Neurologic: Grossly normal    Assessment:Plan  1. Preventative health care Cont exercise & diet changes See gyn for pap & mmg this year - CBC - Comprehensive metabolic panel - Lipid panel - Hemoglobin A1c - Vit D  25 hydroxy (rtn osteoporosis monitoring)  2. Screening for HIV (human immunodeficiency virus) - HIV antibody  3. Mild anemia Cont MV w/iron - Iron and TIBC  4. Situational anxiety If needs more than 2/month, re-evaluate & consider starting low dose zoloft - LORazepam (ATIVAN) 1 MG tablet; Take 1 tablet (1 mg total) by mouth daily as needed for anxiety.  Dispense: 30 tablet; Refill: 0  5. Insomnia - zolpidem (AMBIEN) 5 MG tablet; Take 1 tablet (5 mg total) by mouth at bedtime as needed for sleep.  Dispense: 30 tablet; Refill: 2  F/u 3 mos-insomnia, anxiety

## 2014-08-15 NOTE — Patient Instructions (Signed)
Our office will call you with lab results and any necessary follow up. Take ambien for sleep. Take lorazepam only when you feel panicky.  Great job with weight loss, diet changes, & decreasing soda intake!!!!! Keep up the great work! Please get mammogram & pap smear this year. Plan to follow up in 3 months or sooner if you are needing lorazepam more than twice monthly. It has been a pleasure to care for you!  Preventive Care for Adults, Female A healthy lifestyle and preventive care can promote health and wellness. Preventive health guidelines for women include the following key practices.  A routine yearly physical is a good way to check with your caregiver about your health and preventive screening. It is a chance to share any concerns and updates on your health, and to receive a thorough exam.  Visit your dentist for a routine exam and preventive care every 6 months. Brush your teeth twice a day and floss once a day. Good oral hygiene prevents tooth decay and gum disease.  The frequency of eye exams is based on your age, health, family medical history, use of contact lenses, and other factors. Follow your caregiver's recommendations for frequency of eye exams.  Eat a healthy diet. Foods like vegetables, fruits, whole grains, low-fat dairy products, and lean protein foods contain the nutrients you need without too many calories. Decrease your intake of foods high in solid fats, added sugars, and salt. Eat the right amount of calories for you.Get information about a proper diet from your caregiver, if necessary.  Regular physical exercise is one of the most important things you can do for your health. Most adults should get at least 150 minutes of moderate-intensity exercise (any activity that increases your heart rate and causes you to sweat) each week. In addition, most adults need muscle-strengthening exercises on 2 or more days a week.  Maintain a healthy weight. The body mass index (BMI)  is a screening tool to identify possible weight problems. It provides an estimate of body fat based on height and weight. Your caregiver can help determine your BMI, and can help you achieve or maintain a healthy weight.For adults 20 years and older:  A BMI below 18.5 is considered underweight.  A BMI of 18.5 to 24.9 is normal.  A BMI of 25 to 29.9 is considered overweight.  A BMI of 30 and above is considered obese.  Maintain normal blood lipids and cholesterol levels by exercising and minimizing your intake of saturated fat. Eat a balanced diet with plenty of fruit and vegetables. Blood tests for lipids and cholesterol should begin at age 42 and be repeated every 5 years. If your lipid or cholesterol levels are high, you are over 50, or you are at high risk for heart disease, you may need your cholesterol levels checked more frequently.Ongoing high lipid and cholesterol levels should be treated with medicines if diet and exercise are not effective.  If you smoke, find out from your caregiver how to quit. If you do not use tobacco, do not start.  Lung cancer screening is recommended for adults aged 54 80 years who are at high risk for developing lung cancer because of a history of smoking. Yearly low-dose computed tomography (CT) is recommended for people who have at least a 30-pack-year history of smoking and are a current smoker or have quit within the past 15 years. A pack year of smoking is smoking an average of 1 pack of cigarettes a day for 1  year (for example: 1 pack a day for 30 years or 2 packs a day for 15 years). Yearly screening should continue until the smoker has stopped smoking for at least 15 years. Yearly screening should also be stopped for people who develop a health problem that would prevent them from having lung cancer treatment.  If you are pregnant, do not drink alcohol. If you are breastfeeding, be very cautious about drinking alcohol. If you are not pregnant and choose to  drink alcohol, do not exceed 1 drink per day. One drink is considered to be 12 ounces (355 mL) of beer, 5 ounces (148 mL) of wine, or 1.5 ounces (44 mL) of liquor.  Avoid use of street drugs. Do not share needles with anyone. Ask for help if you need support or instructions about stopping the use of drugs.  High blood pressure causes heart disease and increases the risk of stroke. Your blood pressure should be checked at least every 1 to 2 years. Ongoing high blood pressure should be treated with medicines if weight loss and exercise are not effective.  If you are 89 to 49 years old, ask your caregiver if you should take aspirin to prevent strokes.  Diabetes screening involves taking a blood sample to check your fasting blood sugar level. This should be done once every 3 years, after age 26, if you are within normal weight and without risk factors for diabetes. Testing should be considered at a younger age or be carried out more frequently if you are overweight and have at least 1 risk factor for diabetes.  Breast cancer screening is essential preventive care for women. You should practice "breast self-awareness." This means understanding the normal appearance and feel of your breasts and may include breast self-examination. Any changes detected, no matter how small, should be reported to a caregiver. Women in their 58s and 30s should have a clinical breast exam (CBE) by a caregiver as part of a regular health exam every 1 to 3 years. After age 3, women should have a CBE every year. Starting at age 54, women should consider having a mammography (breast X-ray test) every year. Women who have a family history of breast cancer should talk to their caregiver about genetic screening. Women at a high risk of breast cancer should talk to their caregivers about having magnetic resonance imaging (MRI) and a mammography every year.  Breast cancer gene (BRCA)-related cancer risk assessment is recommended for women  who have family members with BRCA-related cancers. BRCA-related cancers include breast, ovarian, tubal, and peritoneal cancers. Having family members with these cancers may be associated with an increased risk for harmful changes (mutations) in the breast cancer genes BRCA1 and BRCA2. Results of the assessment will determine the need for genetic counseling and BRCA1 and BRCA2 testing.  The Pap test is a screening test for cervical cancer. A Pap test can show cell changes on the cervix that might become cervical cancer if left untreated. A Pap test is a procedure in which cells are obtained and examined from the lower end of the uterus (cervix).  Women should have a Pap test starting at age 29.  Between ages 38 and 18, Pap tests should be repeated every 2 years.  Beginning at age 30, you should have a Pap test every 3 years as long as the past 3 Pap tests have been normal.  Some women have medical problems that increase the chance of getting cervical cancer. Talk to your caregiver about these  problems. It is especially important to talk to your caregiver if a new problem develops soon after your last Pap test. In these cases, your caregiver may recommend more frequent screening and Pap tests.  The above recommendations are the same for women who have or have not gotten the vaccine for human papillomavirus (HPV).  If you had a hysterectomy for a problem that was not cancer or a condition that could lead to cancer, then you no longer need Pap tests. Even if you no longer need a Pap test, a regular exam is a good idea to make sure no other problems are starting.  If you are between ages 15 and 60, and you have had normal Pap tests going back 10 years, you no longer need Pap tests. Even if you no longer need a Pap test, a regular exam is a good idea to make sure no other problems are starting.  If you have had past treatment for cervical cancer or a condition that could lead to cancer, you need Pap  tests and screening for cancer for at least 20 years after your treatment.  If Pap tests have been discontinued, risk factors (such as a new sexual partner) need to be reassessed to determine if screening should be resumed.  The HPV test is an additional test that may be used for cervical cancer screening. The HPV test looks for the virus that can cause the cell changes on the cervix. The cells collected during the Pap test can be tested for HPV. The HPV test could be used to screen women aged 64 years and older, and should be used in women of any age who have unclear Pap test results. After the age of 50, women should have HPV testing at the same frequency as a Pap test.  Colorectal cancer can be detected and often prevented. Most routine colorectal cancer screening begins at the age of 39 and continues through age 84. However, your caregiver may recommend screening at an earlier age if you have risk factors for colon cancer. On a yearly basis, your caregiver may provide home test kits to check for hidden blood in the stool. Use of a small camera at the end of a tube, to directly examine the colon (sigmoidoscopy or colonoscopy), can detect the earliest forms of colorectal cancer. Talk to your caregiver about this at age 4, when routine screening begins. Direct examination of the colon should be repeated every 5 to 10 years through age 43, unless early forms of pre-cancerous polyps or small growths are found.  Hepatitis C blood testing is recommended for all people born from [birth year] through 1965 and any individual with known risks for hepatitis C.  Practice safe sex. Use condoms and avoid high-risk sexual practices to reduce the spread of sexually transmitted infections (STIs). STIs include gonorrhea, chlamydia, syphilis, trichomonas, herpes, HPV, and human immunodeficiency virus (HIV). Herpes, HIV, and HPV are viral illnesses that have no cure. They can result in disability, cancer, and death. Sexually  active women aged 34 and younger should be checked for chlamydia. Older women with new or multiple partners should also be tested for chlamydia. Testing for other STIs is recommended if you are sexually active and at increased risk.  Osteoporosis is a disease in which the bones lose minerals and strength with aging. This can result in serious bone fractures. The risk of osteoporosis can be identified using a bone density scan. Women ages 9 and over and women at risk for fractures  or osteoporosis should discuss screening with their caregivers. Ask your caregiver whether you should take a calcium supplement or vitamin D to reduce the rate of osteoporosis.  Menopause can be associated with physical symptoms and risks. Hormone replacement therapy is available to decrease symptoms and risks. You should talk to your caregiver about whether hormone replacement therapy is right for you.  Use sunscreen. Apply sunscreen liberally and repeatedly throughout the day. You should seek shade when your shadow is shorter than you. Protect yourself by wearing long sleeves, pants, a wide-brimmed hat, and sunglasses year round, whenever you are outdoors.  Once a month, do a whole body skin exam, using a mirror to look at the skin on your back. Notify your caregiver of new moles, moles that have irregular borders, moles that are larger than a pencil eraser, or moles that have changed in shape or color.  Stay current with required immunizations.  Influenza vaccine. All adults should be immunized every year.  Tetanus, diphtheria, and acellular pertussis (Td, Tdap) vaccine. Pregnant women should receive 1 dose of Tdap vaccine during each pregnancy. The dose should be obtained regardless of the length of time since the last dose. Immunization is preferred during the 27th to 36th week of gestation. An adult who has not previously received Tdap or who does not know her vaccine status should receive 1 dose of Tdap. This initial  dose should be followed by tetanus and diphtheria toxoids (Td) booster doses every 10 years. Adults with an unknown or incomplete history of completing a 3-dose immunization series with Td-containing vaccines should begin or complete a primary immunization series including a Tdap dose. Adults should receive a Td booster every 10 years.  Varicella vaccine. An adult without evidence of immunity to varicella should receive 2 doses or a second dose if she has previously received 1 dose. Pregnant females who do not have evidence of immunity should receive the first dose after pregnancy. This first dose should be obtained before leaving the health care facility. The second dose should be obtained 4 8 weeks after the first dose.  Human papillomavirus (HPV) vaccine. Females aged 49 26 years who have not received the vaccine previously should obtain the 3-dose series. The vaccine is not recommended for use in pregnant females. However, pregnancy testing is not needed before receiving a dose. If a female is found to be pregnant after receiving a dose, no treatment is needed. In that case, the remaining doses should be delayed until after the pregnancy. Immunization is recommended for any person with an immunocompromised condition through the age of 40 years if she did not get any or all doses earlier. During the 3-dose series, the second dose should be obtained 4 8 weeks after the first dose. The third dose should be obtained 24 weeks after the first dose and 16 weeks after the second dose.  Zoster vaccine. One dose is recommended for adults aged 32 years or older unless certain conditions are present.  Measles, mumps, and rubella (MMR) vaccine. Adults born before 21 generally are considered immune to measles and mumps. Adults born in [birth year] or later should have 1 or more doses of MMR vaccine unless there is a contraindication to the vaccine or there is laboratory evidence of immunity to each of the three diseases. A  routine second dose of MMR vaccine should be obtained at least 28 days after the first dose for students attending postsecondary schools, health care workers, or international travelers. People who received inactivated measles  vaccine or an unknown type of measles vaccine during 1963 1967 should receive 2 doses of MMR vaccine. People who received inactivated mumps vaccine or an unknown type of mumps vaccine before 1979 and are at high risk for mumps infection should consider immunization with 2 doses of MMR vaccine. For females of childbearing age, rubella immunity should be determined. If there is no evidence of immunity, females who are not pregnant should be vaccinated. If there is no evidence of immunity, females who are pregnant should delay immunization until after pregnancy. Unvaccinated health care workers born before 60 who lack laboratory evidence of measles, mumps, or rubella immunity or laboratory confirmation of disease should consider measles and mumps immunization with 2 doses of MMR vaccine or rubella immunization with 1 dose of MMR vaccine.  Pneumococcal 13-valent conjugate (PCV13) vaccine. When indicated, a person who is uncertain of her immunization history and has no record of immunization should receive the PCV13 vaccine. An adult aged 65 years or older who has certain medical conditions and has not been previously immunized should receive 1 dose of PCV13 vaccine. This PCV13 should be followed with a dose of pneumococcal polysaccharide (PPSV23) vaccine. The PPSV23 vaccine dose should be obtained at least 8 weeks after the dose of PCV13 vaccine. An adult aged 54 years or older who has certain medical conditions and previously received 1 or more doses of PPSV23 vaccine should receive 1 dose of PCV13. The PCV13 vaccine dose should be obtained 1 or more years after the last PPSV23 vaccine dose.  Pneumococcal polysaccharide (PPSV23) vaccine. When PCV13 is also indicated, PCV13 should be  obtained first. All adults aged 50 years and older should be immunized. An adult younger than age 40 years who has certain medical conditions should be immunized. Any person who resides in a nursing home or long-term care facility should be immunized. An adult smoker should be immunized. People with an immunocompromised condition and certain other conditions should receive both PCV13 and PPSV23 vaccines. People with human immunodeficiency virus (HIV) infection should be immunized as soon as possible after diagnosis. Immunization during chemotherapy or radiation therapy should be avoided. Routine use of PPSV23 vaccine is not recommended for American Indians, [place] Natives, or people younger than 65 years unless there are medical conditions that require PPSV23 vaccine. When indicated, people who have unknown immunization and have no record of immunization should receive PPSV23 vaccine. One-time revaccination 5 years after the first dose of PPSV23 is recommended for people aged 48 64 years who have chronic kidney failure, nephrotic syndrome, asplenia, or immunocompromised conditions. People who received 1 2 doses of PPSV23 before age 79 years should receive another dose of PPSV23 vaccine at age 53 years or later if at least 5 years have passed since the previous dose. Doses of PPSV23 are not needed for people immunized with PPSV23 at or after age 11 years.  Meningococcal vaccine. Adults with asplenia or persistent complement component deficiencies should receive 2 doses of quadrivalent meningococcal conjugate (MenACWY-D) vaccine. The doses should be obtained at least 2 months apart. Microbiologists working with certain meningococcal bacteria, [employer] recruits, people at risk during an outbreak, and people who travel to or live in countries with a high rate of meningitis should be immunized. A first-year college student up through age 64 years who is living in a residence hall should receive a dose if she did not  receive a dose on or after her 16th birthday. Adults who have certain high-risk conditions should receive one or  more doses of vaccine.  Hepatitis A vaccine. Adults who wish to be protected from this disease, have certain high-risk conditions, work with hepatitis A-infected animals, work in hepatitis A research labs, or travel to or work in countries with a high rate of hepatitis A should be immunized. Adults who were previously unvaccinated and who anticipate close contact with an international adoptee during the first 60 days after arrival in the [place] from a country with a high rate of hepatitis A should be immunized.  Hepatitis B vaccine. Adults who wish to be protected from this disease, have certain high-risk conditions, may be exposed to blood or other infectious body fluids, are household contacts or sex partners of hepatitis B positive people, are clients or workers in certain care facilities, or travel to or work in countries with a high rate of hepatitis B should be immunized.  Haemophilus influenzae type b (Hib) vaccine. A previously unvaccinated person with asplenia or sickle cell disease or having a scheduled splenectomy should receive 1 dose of Hib vaccine. Regardless of previous immunization, a recipient of a hematopoietic stem cell transplant should receive a 3-dose series 6 12 months after her successful transplant. Hib vaccine is not recommended for adults with HIV infection. Preventive Services / Frequency Ages 80 to 9  Blood pressure check.** / Every 1 to 2 years.  Lipid and cholesterol check.** / Every 5 years beginning at age 58.  Clinical breast exam.** / Every 3 years for women in their 92s and 63s.  BRCA-related cancer risk assessment.** / For women who have family members with a BRCA-related cancer (breast, ovarian, tubal, or peritoneal cancers).  Pap test.** / Every 2 years from ages 40 through 40. Every 3 years starting at age 33 through age 41 or 3 with a  history of 3 consecutive normal Pap tests.  HPV screening.** / Every 3 years from ages 77 through ages 77 to 76 with a history of 3 consecutive normal Pap tests.  Hepatitis C blood test.** / For any individual with known risks for hepatitis C.  Skin self-exam. / Monthly.  Influenza vaccine. / Every year.  Tetanus, diphtheria, and acellular pertussis (Tdap, Td) vaccine.** / Consult your caregiver. Pregnant women should receive 1 dose of Tdap vaccine during each pregnancy. 1 dose of Td every 10 years.  Varicella vaccine.** / Consult your caregiver. Pregnant females who do not have evidence of immunity should receive the first dose after pregnancy.  HPV vaccine. / 3 doses over 6 months, if 33 and younger. The vaccine is not recommended for use in pregnant females. However, pregnancy testing is not needed before receiving a dose.  Measles, mumps, rubella (MMR) vaccine.** / You need at least 1 dose of MMR if you were born in 1957 or later. You may also need a 2nd dose. For females of childbearing age, rubella immunity should be determined. If there is no evidence of immunity, females who are not pregnant should be vaccinated. If there is no evidence of immunity, females who are pregnant should delay immunization until after pregnancy.  Pneumococcal 13-valent conjugate (PCV13) vaccine.** / Consult your caregiver.  Pneumococcal polysaccharide (PPSV23) vaccine.** / 1 to 2 doses if you smoke cigarettes or if you have certain conditions.  Meningococcal vaccine.** / 1 dose if you are age 74 to 42 years and a Market researcher living in a residence hall, or have one of several medical conditions, you need to get vaccinated against meningococcal disease. You may also need additional booster  doses.  Hepatitis A vaccine.** / Consult your caregiver.  Hepatitis B vaccine.** / Consult your caregiver.  Haemophilus influenzae type b (Hib) vaccine.** / Consult your caregiver. Ages 69 to 59  Blood  pressure check.** / Every 1 to 2 years.  Lipid and cholesterol check.** / Every 5 years beginning at age 75.  Lung cancer screening. / Every year if you are aged 79 80 years and have a 30-pack-year history of smoking and currently smoke or have quit within the past 15 years. Yearly screening is stopped once you have quit smoking for at least 15 years or develop a health problem that would prevent you from having lung cancer treatment.  Clinical breast exam.** / Every year after age 31.  BRCA-related cancer risk assessment.** / For women who have family members with a BRCA-related cancer (breast, ovarian, tubal, or peritoneal cancers).  Mammogram.** / Every year beginning at age 67 and continuing for as long as you are in good health. Consult with your caregiver.  Pap test.** / Every 3 years starting at age 66 through age 67 or 88 with a history of 3 consecutive normal Pap tests.  HPV screening.** / Every 3 years from ages 15 through ages 46 to 7 with a history of 3 consecutive normal Pap tests.  Fecal occult blood test (FOBT) of stool. / Every year beginning at age 89 and continuing until age 56. You may not need to do this test if you get a colonoscopy every 10 years.  Flexible sigmoidoscopy or colonoscopy.** / Every 5 years for a flexible sigmoidoscopy or every 10 years for a colonoscopy beginning at age 55 and continuing until age 73.  Hepatitis C blood test.** / For all people born from [birth year] through 1965 and any individual with known risks for hepatitis C.  Skin self-exam. / Monthly.  Influenza vaccine. / Every year.  Tetanus, diphtheria, and acellular pertussis (Tdap/Td) vaccine.** / Consult your caregiver. Pregnant women should receive 1 dose of Tdap vaccine during each pregnancy. 1 dose of Td every 10 years.  Varicella vaccine.** / Consult your caregiver. Pregnant females who do not have evidence of immunity should receive the first dose after pregnancy.  Zoster vaccine.** / 1  dose for adults aged 64 years or older.  Measles, mumps, rubella (MMR) vaccine.** / You need at least 1 dose of MMR if you were born in 1957 or later. You may also need a 2nd dose. For females of childbearing age, rubella immunity should be determined. If there is no evidence of immunity, females who are not pregnant should be vaccinated. If there is no evidence of immunity, females who are pregnant should delay immunization until after pregnancy.  Pneumococcal 13-valent conjugate (PCV13) vaccine.** / Consult your caregiver.  Pneumococcal polysaccharide (PPSV23) vaccine.** / 1 to 2 doses if you smoke cigarettes or if you have certain conditions.  Meningococcal vaccine.** / Consult your caregiver.  Hepatitis A vaccine.** / Consult your caregiver.  Hepatitis B vaccine.** / Consult your caregiver.  Haemophilus influenzae type b (Hib) vaccine.** / Consult your caregiver. Ages 59 and over  Blood pressure check.** / Every 1 to 2 years.  Lipid and cholesterol check.** / Every 5 years beginning at age 44.  Lung cancer screening. / Every year if you are aged 47 80 years and have a 30-pack-year history of smoking and currently smoke or have quit within the past 15 years. Yearly screening is stopped once you have quit smoking for at least 15 years or develop a  health problem that would prevent you from having lung cancer treatment.  Clinical breast exam.** / Every year after age 48.  BRCA-related cancer risk assessment.** / For women who have family members with a BRCA-related cancer (breast, ovarian, tubal, or peritoneal cancers).  Mammogram.** / Every year beginning at age 39 and continuing for as long as you are in good health. Consult with your caregiver.  Pap test.** / Every 3 years starting at age 44 through age 45 or 23 with a 3 consecutive normal Pap tests. Testing can be stopped between 65 and 70 with 3 consecutive normal Pap tests and no abnormal Pap or HPV tests in the past 10  years.  HPV screening.** / Every 3 years from ages 39 through ages 3 or 88 with a history of 3 consecutive normal Pap tests. Testing can be stopped between 65 and 70 with 3 consecutive normal Pap tests and no abnormal Pap or HPV tests in the past 10 years.  Fecal occult blood test (FOBT) of stool. / Every year beginning at age 28 and continuing until age 61. You may not need to do this test if you get a colonoscopy every 10 years.  Flexible sigmoidoscopy or colonoscopy.** / Every 5 years for a flexible sigmoidoscopy or every 10 years for a colonoscopy beginning at age 39 and continuing until age 81.  Hepatitis C blood test.** / For all people born from [birth year] through 1965 and any individual with known risks for hepatitis C.  Osteoporosis screening.** / A one-time screening for women ages 36 and over and women at risk for fractures or osteoporosis.  Skin self-exam. / Monthly.  Influenza vaccine. / Every year.  Tetanus, diphtheria, and acellular pertussis (Tdap/Td) vaccine.** / 1 dose of Td every 10 years.  Varicella vaccine.** / Consult your caregiver.  Zoster vaccine.** / 1 dose for adults aged 76 years or older.  Pneumococcal 13-valent conjugate (PCV13) vaccine.** / Consult your caregiver.  Pneumococcal polysaccharide (PPSV23) vaccine.** / 1 dose for all adults aged 27 years and older.  Meningococcal vaccine.** / Consult your caregiver.  Hepatitis A vaccine.** / Consult your caregiver.  Hepatitis B vaccine.** / Consult your caregiver.  Haemophilus influenzae type b (Hib) vaccine.** / Consult your caregiver. ** Family history and personal history of risk and conditions may change your caregiver's recommendations. Document Released: 03/25/2001 Document Revised: 05/24/2012 Document Reviewed: 06/24/2010 [facility] Patient Information 2014 [place], [place].

## 2014-08-15 NOTE — Progress Notes (Signed)
Pre visit review using our clinic review tool, if applicable. No additional management support is needed unless otherwise documented below in the visit note. 

## 2014-08-16 ENCOUNTER — Telehealth: Payer: Self-pay | Admitting: Nurse Practitioner

## 2014-08-16 DIAGNOSIS — E559 Vitamin D deficiency, unspecified: Secondary | ICD-10-CM

## 2014-08-16 MED ORDER — VITAMIN D3 1.25 MG (50000 UT) PO CAPS: 1.0000 | ORAL_CAPSULE | ORAL | Status: DC

## 2014-08-16 NOTE — Telephone Encounter (Signed)
Left detailed message on pt's cell.  Okay per DPR. 

## 2014-08-16 NOTE — Telephone Encounter (Signed)
pls call pt: Advise Labs look great! No anemia. Iron is good. Cholesterol has improved. Keep up great work!  Vit d Is low. Start prescription vit D. Take 1 capsule weekly with meal for 12 weeks. Level will be checked again at 3 mo f/u appt.

## 2014-12-27 ENCOUNTER — Other Ambulatory Visit: Payer: Self-pay | Admitting: *Deleted

## 2014-12-27 DIAGNOSIS — G43009 Migraine without aura, not intractable, without status migrainosus: Secondary | ICD-10-CM

## 2014-12-27 MED ORDER — SUMATRIPTAN SUCCINATE 100 MG PO TABS: 100.0000 mg | ORAL_TABLET | Freq: Once | ORAL | Status: DC

## 2014-12-27 NOTE — Telephone Encounter (Signed)
Pt called requesting refill.  RF request for imitrex LOV: 08/15/14 Next ov: None Last written: 11/27/13 #10 w/ [id number]

## 2015-01-22 ENCOUNTER — Telehealth: Payer: Self-pay | Admitting: Family Medicine

## 2015-01-22 NOTE — Telephone Encounter (Signed)
Patients phone states [employer] caller is unavailable.  Pt will need to be evaluated to receive the medication.

## 2015-01-22 NOTE — Telephone Encounter (Signed)
Pt is requesting a refill of her Benzonatate 100mg  for her cough. She said it works well.

## 2015-01-23 NOTE — Telephone Encounter (Signed)
Left detailed message on pt's phone stating she will need an appointment to address her cough before getting any Rx cough medications prescribed.  I gave patient phone number to call back to schedule.

## 2015-02-10 ENCOUNTER — Emergency Department (INDEPENDENT_AMBULATORY_CARE_PROVIDER_SITE_OTHER): Payer: 59

## 2015-02-10 ENCOUNTER — Emergency Department (INDEPENDENT_AMBULATORY_CARE_PROVIDER_SITE_OTHER)
Admission: EM | Admit: 2015-02-10 | Discharge: 2015-02-10 | Disposition: A | Discharge status: Home / Self Care | Payer: 59 | Source: Home / Self Care

## 2015-02-10 ENCOUNTER — Encounter (HOSPITAL_COMMUNITY): Payer: Self-pay | Admitting: Emergency Medicine

## 2015-02-10 DIAGNOSIS — R091 Pleurisy: Secondary | ICD-10-CM | POA: Diagnosis not present

## 2015-02-10 MED ORDER — NAPROXEN 500 MG PO TABS: 500.0000 mg | ORAL_TABLET | Freq: Two times a day (BID) | ORAL | Status: DC

## 2015-02-10 MED ORDER — AZITHROMYCIN 250 MG PO TABS: ORAL_TABLET | ORAL | Status: DC

## 2015-02-10 MED ORDER — KETOROLAC TROMETHAMINE 30 MG/ML IJ SOLN
INTRAMUSCULAR | Status: AC
  Filled 2015-02-10: qty 1

## 2015-02-10 MED ORDER — KETOROLAC TROMETHAMINE 30 MG/ML IJ SOLN
30.0000 mg | Freq: Once | INTRAMUSCULAR | Status: AC
  Administered 2015-02-10: 30 mg via INTRAMUSCULAR

## 2015-02-10 NOTE — ED Notes (Signed)
[facility] complaint of off and on chest pain x 1 week. She stated that today the pain is a 7/10 and is described as someone sitting on her chest. The patient stated that the pain does get worse upon inspiration. The patient stated that nothing triggers the pain and nothing relieves the pain. The patient denied any cardiac hx.

## 2015-02-10 NOTE — Discharge Instructions (Signed)
Pleurisy °Pleurisy is redness, puffiness (swelling), and soreness (inflammation) of the lining of the lungs. It can be hard to breathe and hurt to breathe. Coughing or deep breathing will make it hurt more. It is often caused by an existing infection or disease.  °HOME CARE °· Only take medicine as told by your doctor. °· Only take antibiotic medicine as directed. Make sure to finish it even if you start to feel better. °GET HELP RIGHT AWAY IF:  °· Your lips, fingernails, or toenails are blue or dark. °· You cough up blood. °· You have a hard time breathing. °· Your pain is not controlled with medicine or it lasts for more than 1 week. °· Your pain spreads (radiates) into your neck, arms, or jaw. °· You are short of breath or wheezing. °· You develop a fever, rash, throw up (vomit), or faint. °MAKE SURE YOU:  °· Understand these instructions. °· Will watch your condition. °· Will get help right away if you are not doing well or get worse. °  °This information is not intended to replace advice given to you by your health care provider. Make sure you discuss any questions you have with your health care provider. °  °Document Released: 01/10/2008 Document Revised: 09/29/2012 Document Reviewed: 07/11/2012 °Elsevier Interactive Patient Education ©2016 [employer] ° °

## 2015-02-26 ENCOUNTER — Ambulatory Visit: Payer: 59 | Admitting: Family Medicine

## 2015-03-02 ENCOUNTER — Encounter: Payer: Self-pay | Admitting: Family Medicine

## 2015-03-02 ENCOUNTER — Ambulatory Visit (INDEPENDENT_AMBULATORY_CARE_PROVIDER_SITE_OTHER): Payer: 59 | Admitting: Family Medicine

## 2015-03-02 VITALS — BP 112/78 | HR 74 | Temp 97.8°F | Resp 16 | Ht 69.5 in | Wt 197.2 lb

## 2015-03-02 DIAGNOSIS — F411 Generalized anxiety disorder: Secondary | ICD-10-CM

## 2015-03-02 DIAGNOSIS — R071 Chest pain on breathing: Secondary | ICD-10-CM | POA: Diagnosis not present

## 2015-03-02 DIAGNOSIS — G4700 Insomnia, unspecified: Secondary | ICD-10-CM | POA: Diagnosis not present

## 2015-03-02 DIAGNOSIS — G43009 Migraine without aura, not intractable, without status migrainosus: Secondary | ICD-10-CM

## 2015-03-02 DIAGNOSIS — R0789 Other chest pain: Secondary | ICD-10-CM

## 2015-03-02 MED ORDER — BENZONATATE 100 MG PO CAPS: 200.0000 mg | ORAL_CAPSULE | Freq: Three times a day (TID) | ORAL | Status: DC | PRN

## 2015-03-02 MED ORDER — ZOLPIDEM TARTRATE 5 MG PO TABS: 5.0000 mg | ORAL_TABLET | Freq: Every evening | ORAL | Status: DC | PRN

## 2015-03-02 MED ORDER — SUMATRIPTAN SUCCINATE 100 MG PO TABS: 100.0000 mg | ORAL_TABLET | Freq: Once | ORAL | Status: DC

## 2015-03-02 NOTE — Progress Notes (Signed)
Pre visit review using our clinic review tool, if applicable. No additional management support is needed unless otherwise documented below in the visit note. 

## 2015-03-02 NOTE — Patient Instructions (Signed)
Take your naproxen with food for the next 10 days.

## 2015-03-02 NOTE — Progress Notes (Signed)
OFFICE VISIT  03/02/2015   CC:  Chief Complaint  Patient presents with  . Chest Pain    seen at urgent care   HPI:    Patient is a 50 y.o. Caucasian female who presents for f/u UC visit 02/10/15  for chest pains.  The note from this visit is unfortunately not in the EMR, but a CXR and EKG from that visit is present in EMR. She had been having chest pains for about 2 wks on and off.  Feels the pain on her breast bone and it is worse with trying to take a deep breath.  Not exertionally related.  She has had some dry cough.  No fever or malaise.  She had some intermittent numbness feeling in L hand.  No nausea, no palpitations, no SOB, no jaw pain, no diaphoresis.  She said the pain feeling would make her anxious and then she would start to take shorter and more rapid breaths. She has never had this kind of chest pain before.  When she went to the [facility] in [place] 02/10/15 and she was dx'd with pleurisy.  CXR and EKG were both normal. Z pack, tessalon perles, and anti-inflammatory was rx'd.  She took the z pack but admits to not taking much of the anti-inflammatory med.  She has started taking a low dose ASA qd.  She reports feeling much improved over the last 3 weeks but is not all the way back to normal. High stress lately--as per her usual, nothing specific, just life in general.  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  Past Medical History  Diagnosis Date  . Anemia   . Urinary tract bacterial infections   . Seasonal allergies   . Vaginal dryness   . History of sexually transmitted disease     trich, gonorrhea, lice  . History of hematuria   . Anxiety     Past Surgical History  Procedure Laterality Date  . Cesarean section    . Tubal ligation    . Eye surgery      Lasix  . Tympanoplasty  2010    Outpatient Prescriptions Prior to Visit  Medication Sig Dispense Refill  . LORazepam (ATIVAN) 1 MG tablet Take 1  tablet (1 mg total) by mouth daily as needed for anxiety. 30 tablet 0  . Multiple Vitamin (MULTIVITAMIN) tablet Take 1 tablet by mouth daily.      . SUMAtriptan (IMITREX) 100 MG tablet Take 1 tablet (100 mg total) by mouth once. May repeat in 2 hours if headache persists or recurs. 10 tablet 0  . zolpidem (AMBIEN) 5 MG tablet Take 1 tablet (5 mg total) by mouth at bedtime as needed for sleep. 30 tablet 2  . azithromycin (ZITHROMAX) 250 MG tablet Take first 2 tablets together, then 1 every day until finished. (Patient not taking: Reported on 03/02/2015) 6 tablet 0  . Cholecalciferol (VITAMIN D3) 50000 UNITS CAPS Take 1 capsule by mouth every 7 (seven) days. (Patient not taking: Reported on 03/02/2015) 12 capsule 0  . hydrocortisone 1 % cream Apply topically 2 (two) times daily. Reported on 03/02/2015    . naproxen (NAPROSYN) 500 MG tablet Take 1 tablet (500 mg total) by mouth 2 (two) times daily with a meal. (Patient not taking: Reported on 03/02/2015) 30 tablet 0   No facility-administered medications prior to visit.    Allergies  Allergen Reactions  . Sulfonamide  Derivatives     REACTION: Hives  . Penicillins Rash    ROS As per HPI  PE: Blood pressure 112/78, pulse 74, temperature 97.8 F (36.6 C), temperature source Oral, resp. rate 16, height 5' 9.5" (1.765 m), weight 197 lb 4 oz (89.472 kg), last menstrual period 01/29/2015, SpO2 93 %.  Pt examined with Wallace Keller, CMA, as chaperone.  Gen: Alert, well appearing.  Patient is oriented to person, place, time, and situation. ZOX:WRUE: no injection, icteris, swelling, or exudate.  EOMI, PERRLA. Mouth: lips without lesion/swelling.  Oral mucosa pink and moist. Oropharynx without erythema, exudate, or swelling.  Neck - No masses or thyromegaly or limitation in range of motion CV: RRR, no m/r/g.   LUNGS: CTA bilat, nonlabored resps, good aeration in all lung fields. Chest wall is tender centrally over distal 1/2 of sternum and a bit over  the costochondral junction on L parasternal region. EXT: no clubbing, cyanosis, or edema.   LABS:  None today  02/10/15 12 lead EKG showed NSR, normal EKG.  Chest x-ray on same date was normal.  IMPRESSION AND PLAN:  Nonspecific chest wall pain; reviewed data with pt.  Reassured her that I don't think any further testing needs to be done. I recommended she finish out 10 more days of naproxen (she has not been taking this with any regularity like it was prescribed). I RF'd tessalon perles for her b/c she says this does help when she gets her occasional dry cough. Also RF'd imitrex and ambien for pt today.  An After Visit Summary was printed and given to the patient.  Spent 30 min with pt today, with >50% of this time spent in counseling and care coordination regarding the above problems.  FOLLOW UP: Return if symptoms worsen or fail to improve.

## 2015-03-13 ENCOUNTER — Telehealth: Payer: Self-pay | Admitting: *Deleted

## 2015-03-13 ENCOUNTER — Ambulatory Visit (INDEPENDENT_AMBULATORY_CARE_PROVIDER_SITE_OTHER): Payer: 59

## 2015-03-13 ENCOUNTER — Other Ambulatory Visit: Payer: Self-pay | Admitting: Family Medicine

## 2015-03-13 DIAGNOSIS — Z23 Encounter for immunization: Secondary | ICD-10-CM

## 2015-03-13 MED ORDER — NAPROXEN 500 MG PO TABS: 500.0000 mg | ORAL_TABLET | Freq: Two times a day (BID) | ORAL | Status: DC

## 2015-03-13 NOTE — Progress Notes (Signed)
Left message on cell vm stating that Rx has been sent to her pharmacy.

## 2015-03-13 NOTE — Progress Notes (Signed)
Naproxen RF eRx'd.

## 2015-03-13 NOTE — Telephone Encounter (Signed)
Pt came by office today for flu shot and requested a refill for naproxen.   RF request for naproxen - this medication is not on pts medication list LOV: 03/02/15 Next ov: None Last written: 02/10/15 #30 w/ 0RF    Please advise. Thanks.

## 2015-03-14 NOTE — Telephone Encounter (Signed)
Rx refilled.

## 2015-03-19 NOTE — ED Provider Notes (Signed)
CSN: [id number]     Arrival date & time 02/10/15  1704 History   None    Chief Complaint  Patient presents with  . Chest Pain  . Shortness of Breath   (Consider location/radiation/quality/duration/timing/severity/associated sxs/prior Treatment) HPI History obtained from patient:   LOCATION:chest SEVERITY:6 DURATION:3 days CONTEXT:sudeen onset with recent URI QUALITY:sharp MODIFYING FACTORS:icy hot OTC meds without relief ASSOCIATED SYMPTOMS:URI symptoms, cough,  TIMING:episodic to constant OCCUPATION:  Past Medical History  Diagnosis Date  . Anemia   . Urinary tract bacterial infections   . Seasonal allergies   . Vaginal dryness   . History of sexually transmitted disease     trich, gonorrhea, lice  . History of hematuria   . Anxiety    Past Surgical History  Procedure Laterality Date  . Cesarean section    . Tubal ligation    . Eye surgery      Lasix  . Tympanoplasty  2010   Family History  Problem Relation Age of Onset  . Hypertension Mother   . Dementia Mother   . Heart disease Father   . Cancer Father    Social History  Substance Use Topics  . Smoking status: Never Smoker   . Smokeless tobacco: Never Used  . Alcohol Use: No   OB History    Gravida Para Term Preterm AB TAB SAB Ectopic Multiple Living   Obstetric Comments   Menarche 50 yo., cycle q28d, lasts 3-4 d, mod flow.     Review of Systems  Allergies  Sulfonamide derivatives and Penicillins  Home Medications   Prior to Admission medications   Medication Sig Start Date End Date Taking? Authorizing Provider  benzonatate (TESSALON) 100 MG capsule Take 2 capsules (200 mg total) by mouth 3 (three) times daily as needed for cough. 03/02/15   Jeoffrey Massed, MD  LORazepam (ATIVAN) 1 MG tablet Take 1 tablet (1 mg total) by mouth daily as needed for anxiety. 08/15/14   Kelle Darting, NP  Multiple Vitamin (MULTIVITAMIN) tablet Take 1 tablet by mouth daily.      Historical  Provider, MD  naproxen (NAPROSYN) 500 MG tablet Take 1 tablet (500 mg total) by mouth 2 (two) times daily with a meal. 03/13/15   Jeoffrey Massed, MD  SUMAtriptan (IMITREX) 100 MG tablet Take 1 tablet (100 mg total) by mouth once. May repeat in 2 hours if headache persists or recurs. 03/02/15   Jeoffrey Massed, MD  zolpidem (AMBIEN) 5 MG tablet Take 1 tablet (5 mg total) by mouth at bedtime as needed for sleep. 03/02/15   Jeoffrey Massed, MD   Meds Ordered and Administered this Visit   Medications  ketorolac (TORADOL) 30 MG/ML injection 30 mg (30 mg Intramuscular Given 02/10/15 1818)    BP 146/100 mmHg  Pulse 74  Temp(Src) 97.9 F (36.6 C) (Oral)  Resp 20  SpO2 100%  LMP 12/09/2014 (Exact Date) No data found.   Physical Exam  Constitutional: She is oriented to person, place, and time. She appears well-developed and well-nourished.  HENT:  Head: Normocephalic and atraumatic.  Pulmonary/Chest: Effort normal and breath sounds normal. She exhibits tenderness.  Musculoskeletal: Normal range of motion.  Neurological: She is alert and oriented to person, place, and time.  Skin: Skin is warm and dry.  Psychiatric: She has a normal mood and affect. Her behavior is normal.  Nursing note and vitals reviewed.  ED Course  Procedures (including critical care time)  Labs Review Labs Reviewed - No data to display  Imaging Review No results found.   Visual Acuity Review  Right Eye Distance:   Left Eye Distance:   Bilateral Distance:    Right Eye Near:   Left Eye Near:    Bilateral Near:        Review of CXR, no infiltrate,  EKG: normal EKG, normal sinus rhythm, there are no previous tracings available for comparison.  MDM   1. Pleurisy    Patient is advised to continue home symptomatic treatment. Prescription for naproxen, zpak  sent pharmacy patient has indicated. Patient is advised that if there are new or worsening symptoms or attend the emergency department, or  contact primary care provider. Instructions of care provided discharged home in stable condition. Return to work/school note provided.  THIS NOTE WAS GENERATED USING A VOICE RECOGNITION SOFTWARE PROGRAM. ALL REASONABLE EFFORTS  WERE MADE TO PROOFREAD THIS DOCUMENT FOR ACCURACY.     Tharon Aquas, PA 03/19/15 1317

## 2015-04-24 ENCOUNTER — Other Ambulatory Visit: Payer: Self-pay | Admitting: *Deleted

## 2015-04-24 MED ORDER — NAPROXEN 500 MG PO TABS: ORAL_TABLET | ORAL | Status: DC

## 2015-04-24 NOTE — Telephone Encounter (Signed)
RF request for naproxen LOV: 03/02/15 Next ov: None Last written: 03/13/15 #30 w/ 0RF  Please advise. Thanks.

## 2015-05-31 ENCOUNTER — Telehealth: Payer: Self-pay | Admitting: *Deleted

## 2015-05-31 DIAGNOSIS — G43009 Migraine without aura, not intractable, without status migrainosus: Secondary | ICD-10-CM

## 2015-05-31 MED ORDER — SUMATRIPTAN SUCCINATE 100 MG PO TABS: 100.0000 mg | ORAL_TABLET | Freq: Once | ORAL | Status: DC

## 2015-05-31 NOTE — Telephone Encounter (Signed)
I sent in rx for #20 sumatriptan pills to see if pharmacy will fill it.

## 2015-05-31 NOTE — Telephone Encounter (Signed)
Pt Colleen Ruiz on 05/31/15 at 3:36pm requesting to call back due to migraine.   I spoke to pt and she stated that she has had 2 migraines in the past 3 weeks. She stated that she has been halving her tablets to make sure she has enough to get her by. She wants to know if Dr. Milinda CaveMcGowen can send in more for her. She stated that she has enough to get her by for the next 2 days. She stated that she does have to two refills left. She stated that she will see if the pharmacy will go ahead and refill. Please advise. Thanks.

## 2015-06-01 NOTE — Telephone Encounter (Signed)
Pt advised and voiced understanding.   

## 2015-06-01 NOTE — Telephone Encounter (Signed)
Spoke to [place] at [facility] and she stated that they did receive Rx for the qty of 20 tabs but that pts insurance will only allow #9 x 15 days. Left message for pt to call back.

## 2015-10-08 ENCOUNTER — Other Ambulatory Visit: Payer: Self-pay | Admitting: Family Medicine

## 2015-10-08 DIAGNOSIS — G4700 Insomnia, unspecified: Secondary | ICD-10-CM

## 2015-10-08 MED ORDER — ZOLPIDEM TARTRATE 5 MG PO TABS: 5.0000 mg | ORAL_TABLET | Freq: Every evening | ORAL | 5 refills | Status: DC | PRN

## 2015-10-08 NOTE — Telephone Encounter (Signed)
Rx faxed

## 2015-10-08 NOTE — Telephone Encounter (Signed)
Pt requesting rf of ambien. Last OV 03/02/15 No upcoming Appt. Last RX 03/02/15 # 30 x 5 rfs.

## 2016-01-10 ENCOUNTER — Telehealth: Payer: Self-pay | Admitting: Family Medicine

## 2016-01-10 DIAGNOSIS — Z1211 Encounter for screening for malignant neoplasm of colon: Secondary | ICD-10-CM

## 2016-01-10 NOTE — Telephone Encounter (Signed)
[facility] referral [facility] has been ordered.

## 2016-01-10 NOTE — Telephone Encounter (Signed)
Patient's insurance will pay for a colonoscopy this year. She is unsure of benefits for next year. Colleen Ruiz can schedule her for 02/01/16. Can she get a referral for screening colonoscopy?

## 2016-01-10 NOTE — Telephone Encounter (Signed)
Please advise. Thanks.  

## 2016-01-11 NOTE — Telephone Encounter (Signed)
Left detailed message on cell vm, okay per DPR.  

## 2016-01-14 ENCOUNTER — Telehealth: Payer: Self-pay

## 2016-01-14 NOTE — Telephone Encounter (Signed)
[phone] PATIENT CALLED TO SCHEDULE TCS

## 2016-01-17 NOTE — Telephone Encounter (Signed)
LMOM to call.

## 2016-01-21 ENCOUNTER — Encounter: Payer: Self-pay | Admitting: Family Medicine

## 2016-01-21 ENCOUNTER — Ambulatory Visit (INDEPENDENT_AMBULATORY_CARE_PROVIDER_SITE_OTHER): Payer: 59 | Admitting: Family Medicine

## 2016-01-21 VITALS — BP 100/65 | HR 62 | Temp 97.9°F | Resp 16 | Ht 69.0 in | Wt 198.5 lb

## 2016-01-21 DIAGNOSIS — E559 Vitamin D deficiency, unspecified: Secondary | ICD-10-CM

## 2016-01-21 DIAGNOSIS — Z23 Encounter for immunization: Secondary | ICD-10-CM | POA: Diagnosis not present

## 2016-01-21 DIAGNOSIS — Z0000 Encounter for general adult medical examination without abnormal findings: Secondary | ICD-10-CM | POA: Diagnosis not present

## 2016-01-21 NOTE — Progress Notes (Signed)
Office Note 01/21/2016  CC:  Chief Complaint  Patient presents with  . Annual Exam    Pt is fasting.     HPI:  Colleen Ruiz is a 50 y.o. female who is here for annual health maintenance exam. [place] [facility] [place]  Says pap and mammogram are appropriately UTD. Pt is working on getting an appt for screening colonoscopy---I referred her on 01/10/16.  Exercise: none Diet: "I eat whatever I want".  Lots of Coke.  Not fasting today.  Has eye exam set up soon. Keeping up with dental preventative.   Past Medical History:  Diagnosis Date  . Anemia   . Anxiety   . History of hematuria   . History of sexually transmitted disease    trich, gonorrhea, lice  . Seasonal allergies   . Urinary tract bacterial infections   . Vaginal dryness     Past Surgical History:  Procedure Laterality Date  . CESAREAN SECTION    . EYE SURGERY     Lasix  . TUBAL LIGATION    . TYMPANOPLASTY  2010    Family History  Problem Relation Age of Onset  . Hypertension Mother   . Dementia Mother   . Heart disease Father   . Cancer Father     Social History   Social History  . Marital status: Married    Spouse name: N/A  . Number of children: 2  . Years of education: N/A   Occupational History  .  [employer]   Social History Main Topics  . Smoking status: Never Smoker  . Smokeless tobacco: Never Used  . Alcohol use No  . Drug use: No  . Sexual activity: Yes    Birth control/ protection: Surgical   Other Topics Concern  . Not on file   Social History Narrative   Ms Colleen SnareLowe lives with her husband, works for [employer], and has two grown children. Her son serves in the [employer] and is currently serving in [place].  She will be going to [place] 04/2014 to visit him.  Her daughter studied psychology at [employer] and has returned for graduate studies.    No T/A/Ds.    Outpatient Medications Prior to Visit  Medication Sig Dispense Refill   . LORazepam (ATIVAN) 1 MG tablet Take 1 tablet (1 mg total) by mouth daily as needed for anxiety. 30 tablet 0  . Multiple Vitamin (MULTIVITAMIN) tablet Take 1 tablet by mouth daily.      . naproxen (NAPROSYN) 500 MG tablet 1 tab po bid with food prn musculoskeletal pain 30 tablet 2  . SUMAtriptan (IMITREX) 100 MG tablet Take 1 tablet (100 mg total) by mouth once. May repeat in 2 hours if headache persists or recurs. 20 tablet 3  . zolpidem (AMBIEN) 5 MG tablet Take 1 tablet (5 mg total) by mouth at bedtime as needed for sleep. 30 tablet 5  . benzonatate (TESSALON) 100 MG capsule Take 2 capsules (200 mg total) by mouth 3 (three) times daily as needed for cough. (Patient not taking: Reported on 01/21/2016) 30 capsule 1   No facility-administered medications prior to visit.     Allergies  Allergen Reactions  . Sulfonamide Derivatives     REACTION: Hives  . Penicillins Rash    ROS Review of Systems  Constitutional: Negative for appetite change, chills, fatigue and fever.  HENT: Negative for congestion, dental problem, ear pain and sore throat.   Eyes:  Negative for discharge, redness and visual disturbance.  Respiratory: Negative for cough, chest tightness, shortness of breath and wheezing.   Cardiovascular: Negative for chest pain, palpitations and leg swelling.  Gastrointestinal: Negative for abdominal pain, blood in stool, diarrhea, nausea and vomiting.  Genitourinary: Negative for difficulty urinating, dysuria, flank pain, frequency, hematuria and urgency.  Musculoskeletal: Negative for arthralgias, back pain, joint swelling, myalgias and neck stiffness.  Skin: Negative for pallor and rash.  Neurological: Negative for dizziness, speech difficulty, weakness and headaches.  Hematological: Negative for adenopathy. Does not bruise/bleed easily.  Psychiatric/Behavioral: Negative for confusion and sleep disturbance. The patient is not nervous/anxious.     PE; Blood pressure 100/65, pulse  62, temperature 97.9 F (36.6 C), temperature source Oral, resp. rate 16, height 5\' 9"  (1.753 m), weight 198 lb 8 oz (90 kg), SpO2 97 %.  Pt examined with Wallace Keller, CMA, as chaperone.  Gen: Alert, well appearing.  Patient is oriented to person, place, time, and situation. AFFECT: pleasant, lucid thought and speech. ENT: Ears: EACs clear, normal epithelium.  TMs with good light reflex and landmarks bilaterally.  Eyes: no injection, icteris, swelling, or exudate.  EOMI, PERRLA. Nose: no drainage or turbinate edema/swelling.  No injection or focal lesion.  Mouth: lips without lesion/swelling.  Oral mucosa pink and moist.  Dentition intact and without obvious caries or gingival swelling.  Oropharynx without erythema, exudate, or swelling.  Neck: supple/nontender.  No LAD, mass, or TM.  Carotid pulses 2+ bilaterally, without bruits. CV: RRR, no m/r/g.   LUNGS: CTA bilat, nonlabored resps, good aeration in all lung fields. ABD: soft, NT, ND, BS normal.  No hepatospenomegaly or mass.  No bruits. EXT: no clubbing, cyanosis, or edema.  Musculoskeletal: no joint swelling, erythema, warmth, or tenderness.  ROM of all joints intact. Skin - no sores or suspicious lesions or rashes or color changes   Pertinent labs:  Lab Results  Component Value Date   TSH 0.507 02/09/2014   Lab Results  Component Value Date   WBC 4.0 08/15/2014   HGB 12.4 08/15/2014   HCT 38.0 08/15/2014   MCV 85.8 08/15/2014   PLT 266.0 08/15/2014   Lab Results  Component Value Date   CREATININE 0.75 08/15/2014   BUN 21 08/15/2014   NA 141 08/15/2014   K 4.9 08/15/2014   CL 105 08/15/2014   CO2 29 08/15/2014   Lab Results  Component Value Date   ALT 25 08/15/2014   AST 20 08/15/2014   ALKPHOS 68 08/15/2014   BILITOT 0.4 08/15/2014   Lab Results  Component Value Date   CHOL 174 08/15/2014   Lab Results  Component Value Date   HDL 52.00 08/15/2014   Lab Results  Component Value Date   LDLCALC 111 (H)  08/15/2014   Lab Results  Component Value Date   TRIG 55.0 08/15/2014   Lab Results  Component Value Date   CHOLHDL 3 08/15/2014   Lab Results  Component Value Date   HGBA1C 5.4 08/15/2014   ASSESSMENT AND PLAN:   Health maintenance exam: Reviewed age and gender appropriate health maintenance issues (prudent diet, regular exercise, health risks of tobacco and excessive alcohol, use of seatbelts, fire alarms in home, use of sunscreen).  Also reviewed age and gender appropriate health screening as well as vaccine recommendations. Flu vaccine today. She is not fasting today so she'll return for lab visit for fasting HP labs + vit D level (hx of vit D deficiency). Colon ca screening: she  is arranging screening colonoscopy with GI. Cervical ca and breast ca screening: UTD via GYN. Encouraged exercise and healthier diet.  An After Visit Summary was printed and given to the patient.  FOLLOW UP:  Return in about 6 months (around 07/21/2016) for f/u anxiety and insomnia; also needs lab visit at her convenience  (fasting, ordered).  Signed:  Santiago BumpersPhil McGowen, MD           01/21/2016

## 2016-01-21 NOTE — Addendum Note (Signed)
Addended by: Smitty KnudsenSUTHERLAND, Myles Mallicoat K on: 01/21/2016 02:36 PM   Modules accepted: Orders

## 2016-01-21 NOTE — Telephone Encounter (Signed)
LMOM to call. Mailing a letter to call again also.

## 2016-01-21 NOTE — Progress Notes (Signed)
Pre visit review using our clinic review tool, if applicable. No additional management support is needed unless otherwise documented below in the visit note. 

## 2016-01-24 ENCOUNTER — Telehealth: Payer: Self-pay

## 2016-01-24 NOTE — Telephone Encounter (Signed)
Pt called to schedule colonoscopy. She received a triage letter from DS and is requesting to have it scheduled for 12/22 the same day as her twin sister. [phone]

## 2016-01-24 NOTE — Telephone Encounter (Signed)
Pt is aware we cannot do on the 22nd, but I could get her in before the end of the year. She said she really wants to wait til after Jan, because of her job dueites. She will call when she is ready.

## 2016-01-31 ENCOUNTER — Other Ambulatory Visit: Payer: 59

## 2016-04-08 ENCOUNTER — Ambulatory Visit (INDEPENDENT_AMBULATORY_CARE_PROVIDER_SITE_OTHER): Payer: 59 | Admitting: Family Medicine

## 2016-04-08 ENCOUNTER — Encounter: Payer: Self-pay | Admitting: Family Medicine

## 2016-04-08 VITALS — BP 124/83 | HR 66 | Temp 98.0°F | Resp 16 | Ht 69.0 in | Wt 198.8 lb

## 2016-04-08 DIAGNOSIS — E559 Vitamin D deficiency, unspecified: Secondary | ICD-10-CM

## 2016-04-08 DIAGNOSIS — G43909 Migraine, unspecified, not intractable, without status migrainosus: Secondary | ICD-10-CM | POA: Diagnosis not present

## 2016-04-08 DIAGNOSIS — Z0000 Encounter for general adult medical examination without abnormal findings: Secondary | ICD-10-CM | POA: Diagnosis not present

## 2016-04-08 DIAGNOSIS — R072 Precordial pain: Secondary | ICD-10-CM

## 2016-04-08 LAB — CBC WITH DIFFERENTIAL/PLATELET
BASOS PCT: 1.1 % (ref 0.0–3.0)
Basophils Absolute: 0 10*3/uL (ref 0.0–0.1)
EOS ABS: 0.1 10*3/uL (ref 0.0–0.7)
EOS PCT: 1.2 % (ref 0.0–5.0)
HEMATOCRIT: 35.7 % — AB (ref 36.0–46.0)
HEMOGLOBIN: 11.9 g/dL — AB (ref 12.0–15.0)
Lymphocytes Relative: 38.4 % (ref 12.0–46.0)
Lymphs Abs: 1.7 10*3/uL (ref 0.7–4.0)
MCHC: 33.3 g/dL (ref 30.0–36.0)
MCV: 84.6 fl (ref 78.0–100.0)
MONO ABS: 0.3 10*3/uL (ref 0.1–1.0)
Monocytes Relative: 6 % (ref 3.0–12.0)
NEUTROS ABS: 2.3 10*3/uL (ref 1.4–7.7)
Neutrophils Relative %: 53.3 % (ref 43.0–77.0)
PLATELETS: 232 10*3/uL (ref 150.0–400.0)
RBC: 4.21 Mil/uL (ref 3.87–5.11)
RDW: 13.4 % (ref 11.5–15.5)
WBC: 4.3 10*3/uL (ref 4.0–10.5)

## 2016-04-08 LAB — COMPREHENSIVE METABOLIC PANEL
ALBUMIN: 3.9 g/dL (ref 3.5–5.2)
ALT: 35 U/L (ref 0–35)
AST: 31 U/L (ref 0–37)
Alkaline Phosphatase: 85 U/L (ref 39–117)
BUN: 9 mg/dL (ref 6–23)
CHLORIDE: 104 meq/L (ref 96–112)
CO2: 30 meq/L (ref 19–32)
CREATININE: 0.66 mg/dL (ref 0.40–1.20)
Calcium: 9 mg/dL (ref 8.4–10.5)
GFR: 121.74 mL/min (ref >60.00)
GLUCOSE: 84 mg/dL (ref 70–99)
POTASSIUM: 4.4 meq/L (ref 3.5–5.1)
SODIUM: 140 meq/L (ref 135–145)
Total Bilirubin: 0.3 mg/dL (ref 0.2–1.2)
Total Protein: 6.9 g/dL (ref 6.0–8.3)

## 2016-04-08 LAB — LIPID PANEL
CHOLESTEROL: 183 mg/dL (ref 0–200)
HDL: 59.2 mg/dL (ref >39.00)
LDL CALC: 112 mg/dL — AB (ref 0–99)
NONHDL: 123.73
Total CHOL/HDL Ratio: 3
Triglycerides: 59 mg/dL (ref 0.0–149.0)
VLDL: 11.8 mg/dL (ref 0.0–40.0)

## 2016-04-08 LAB — TSH: TSH: 1.18 u[IU]/mL (ref 0.35–4.50)

## 2016-04-08 LAB — VITAMIN D 25 HYDROXY (VIT D DEFICIENCY, FRACTURES): VITD: 20.28 ng/mL — ABNORMAL LOW (ref 30.00–100.00)

## 2016-04-08 MED ORDER — RIZATRIPTAN BENZOATE 10 MG PO TABS: 10.0000 mg | ORAL_TABLET | ORAL | 1 refills | Status: DC | PRN

## 2016-04-08 MED ORDER — PROPRANOLOL HCL ER 80 MG PO CP24: ORAL_CAPSULE | ORAL | 1 refills | Status: DC

## 2016-04-08 NOTE — Progress Notes (Signed)
Pre visit review using our clinic review tool, if applicable. No additional management support is needed unless otherwise documented below in the visit note. 

## 2016-04-08 NOTE — Progress Notes (Signed)
OFFICE VISIT  04/09/2016   CC:  Chief Complaint  Patient presents with  . Migraine    frenquent, having to take imitrex often, she states that it makes her sleepy so she can only take half tab during the day   HPI:    Patient is a 51 y.o.  female who presents for headaches. Most recent HA woke her up about 3 AM 4 nights ago, hurts/throbs in forehead and temples constantly, lights and noises make it worse.  Some nausea when the pain gets most severe.  Her typical HA lasts 2-3 d and occurs about every 2 weeks.  No aura.  No known triggers except possibly rainy weather.  She takes 1/2-1 tab of imitrex and goes to sleep and HA goes away for a while.  Also taking 2 tylenol regularly and this helps some. Currently her pain level is 5/10.  She has not taken any medication at all today.  She feels like this current HA is subsiding.  She's never been on migraine preventative medication.    ROS: intermittent sharp pain in sternal region, worse when she takes a deep breath.  The pain can come at rest or with ambulation.  She has been to the ED in the past for this kind of CP and eval was reassuring.  No cough, no fever.  No relation to food intake.  Also, pt still hasn't gotten her fasting blood work ordered at her 01/2016 CPE, so she will get these today while she is here.    Past Medical History:  Diagnosis Date  . Anemia   . Anxiety   . History of hematuria   . History of sexually transmitted disease    trich, gonorrhea, lice  . Migraine syndrome   . Seasonal allergies   . Urinary tract bacterial infections   . Vaginal dryness   . Vitamin D deficiency    High dose Vit D replacement started 04/2016    Past Surgical History:  Procedure Laterality Date  . CESAREAN SECTION    . EYE SURGERY     Lasix  . TUBAL LIGATION    . TYMPANOPLASTY  2010    Outpatient Medications Prior to Visit  Medication Sig Dispense Refill  . LORazepam (ATIVAN) 1 MG tablet Take 1 tablet (1 mg total) by mouth  daily as needed for anxiety. 30 tablet 0  . Multiple Vitamin (MULTIVITAMIN) tablet Take 1 tablet by mouth daily.      . naproxen (NAPROSYN) 500 MG tablet 1 tab po bid with food prn musculoskeletal pain 30 tablet 2  . zolpidem (AMBIEN) 5 MG tablet Take 1 tablet (5 mg total) by mouth at bedtime as needed for sleep. 30 tablet 5  . SUMAtriptan (IMITREX) 100 MG tablet Take 1 tablet (100 mg total) by mouth once. May repeat in 2 hours if headache persists or recurs. 20 tablet 3   No facility-administered medications prior to visit.     Allergies  Allergen Reactions  . Sulfonamide Derivatives     REACTION: Hives  . Penicillins Rash    ROS As per HPI  PE: Blood pressure 124/83, pulse 66, temperature 98 F (36.7 C), temperature source Oral, resp. rate 16, height 5\' 9"  (1.753 m), weight 198 lb 12 oz (90.2 kg), last menstrual period 02/11/2015, SpO2 100 %. Gen: Alert, well appearing.  Patient is oriented to person, place, time, and situation. AFFECT: pleasant, lucid thought and speech. CV: RRR, no m/r/g.  Chest wall: no TTP. LUNGS: CTA bilat,  nonlabored resps, good aeration in all lung fields. EXT: no clubbing, cyanosis, or edema.  Neuro: CN 2-12 intact bilaterally, strength 5/5 in proximal and distal upper extremities and lower extremities bilaterally.  No sensory deficits.  No tremor.  No disdiadochokinesis.  No ataxia.  Upper extremity and lower extremity DTRs symmetric.  No pronator drift.  LABS:   12 lead EKG today: NSR, rate 60, no ischemic changes, normal intervals/durations, normal wave morphology.  No ectopy.  No hypertrophy.  Low voltage in aVL.  Compared to last EKG 02/10/15, the TWI in V1 has resolved, otherwise no change. Marland Kitchen IMPRESSION AND PLAN:  1) Migraine syndrome: not ideally controlled. Start daily prophylaxis with inderal LA 80 mg qhs. Change to maxalt 10mg  as abortive med and we'll see if this one has less sedative effect on her than the imitrex.  2) Precordial pain:  reassured patient that I do not think this is cardiac in etiology. EKG normal. Watchful waiting approach.  3) Health maintenance: at her 01/21/16 CPE she was supposed to return for fasting lab visit but never has. She is fasting today so we'll get those labs now.  An After Visit Summary was printed and given to the patient.  FOLLOW UP: Return in about 4 weeks (around 05/06/2016) for f/u migraines.  Signed:  Santiago Bumpers, MD           04/09/2016

## 2016-04-09 ENCOUNTER — Encounter: Payer: Self-pay | Admitting: Family Medicine

## 2016-04-10 ENCOUNTER — Other Ambulatory Visit: Payer: Self-pay | Admitting: *Deleted

## 2016-04-10 DIAGNOSIS — E559 Vitamin D deficiency, unspecified: Secondary | ICD-10-CM

## 2016-04-10 MED ORDER — VITAMIN D (ERGOCALCIFEROL) 1.25 MG (50000 UNIT) PO CAPS: 50000.0000 [IU] | ORAL_CAPSULE | ORAL | 0 refills | Status: DC

## 2016-04-30 ENCOUNTER — Ambulatory Visit: Payer: 59 | Admitting: Family Medicine

## 2016-05-12 ENCOUNTER — Encounter: Payer: Self-pay | Admitting: Family Medicine

## 2016-05-12 ENCOUNTER — Ambulatory Visit (INDEPENDENT_AMBULATORY_CARE_PROVIDER_SITE_OTHER): Payer: 59 | Admitting: Family Medicine

## 2016-05-12 VITALS — BP 115/77 | HR 51 | Temp 97.6°F | Resp 16 | Ht 69.0 in | Wt 198.8 lb

## 2016-05-12 DIAGNOSIS — G43909 Migraine, unspecified, not intractable, without status migrainosus: Secondary | ICD-10-CM

## 2016-05-12 DIAGNOSIS — E559 Vitamin D deficiency, unspecified: Secondary | ICD-10-CM | POA: Diagnosis not present

## 2016-05-12 MED ORDER — PROPRANOLOL HCL ER 80 MG PO CP24: ORAL_CAPSULE | ORAL | 1 refills | Status: DC

## 2016-05-12 NOTE — Progress Notes (Signed)
OFFICE VISIT  05/12/2016  CC:  Chief Complaint  Patient presents with  . Follow-up    Migraine   HPI:    Patient is a 51 y.o. African-American female who presents for 4 week f/u migraine HAs. Started inderal LA  qhs last visit, also changed her from imitrex to maxalt to see if this had less sedation effect. She is very pleased with her response to inderal LA: not one migraine!  Has not needed to take maxalt. Says that about 10 min after taking the med she feels hot and it lasts all night and even into the next day to a milder degree.   "Like a hot flash that lasts all night".  She did not take her last 2 pills of the month and felt the heat getting less of a problem, but HAs began to return slightly.  She is willing to continue inderal b/c it has worked so well for her migraine prevention. No dizziness, no fatigue.  No bp monitoring but she says she can feel whether bp is high or low by sx's.  Fasting health panel labs done last visit were all normal except Vit D low (21).  Started high dose vit D replacement at that time.  Past Medical History:  Diagnosis Date  . Anemia   . Anxiety   . History of hematuria   . History of sexually transmitted disease    trich, gonorrhea, lice  . Migraine syndrome   . Seasonal allergies   . Urinary tract bacterial infections   . Vaginal dryness   . Vitamin D deficiency    High dose Vit D replacement started 04/2016    Past Surgical History:  Procedure Laterality Date  . CESAREAN SECTION    . EYE SURGERY     Lasix  . TUBAL LIGATION    . TYMPANOPLASTY  2010    Outpatient Medications Prior to Visit  Medication Sig Dispense Refill  . LORazepam (ATIVAN) 1 MG tablet Take 1 tablet (1 mg total) by mouth daily as needed for anxiety. 30 tablet 0  . Multiple Vitamin (MULTIVITAMIN) tablet Take 1 tablet by mouth daily.      . naproxen (NAPROSYN) 500 MG tablet 1 tab po bid with food prn musculoskeletal pain 30 tablet 2  . rizatriptan (MAXALT) 10  MG tablet Take 1 tablet (10 mg total) by mouth as needed for migraine. May repeat in 2 hours if needed.  Max of 30 mg per 24h. 10 tablet 1  . Vitamin D, Ergocalciferol, (DRISDOL) 50000 units CAPS capsule Take 1 capsule (50,000 Units total) by mouth every 7 (seven) days. 12 capsule 0  . zolpidem (AMBIEN) 5 MG tablet Take 1 tablet (5 mg total) by mouth at bedtime as needed for sleep. 30 tablet 5  . propranolol ER (INDERAL LA) 80 MG 24 hr capsule 1 tab po qhs 30 capsule 1   No facility-administered medications prior to visit.     Allergies  Allergen Reactions  . Sulfonamide Derivatives     REACTION: Hives  . Penicillins Rash    ROS As per HPI  PE: Blood pressure 115/77, pulse (!) 51, temperature 97.6 F (36.4 C), temperature source Oral, resp. rate 16, height  (1.753 m), weight 198 lb 12 oz (90.2 kg), SpO2 97 %. Wt Readings from Last 2 Encounters:  05/12/16 198 lb 12 oz (90.2 kg)  04/08/16 198 lb 12 oz (90.2 kg)    Gen: alert, oriented x 4, affect pleasant.  Lucid thinking  and conversation noted. HEENT: PERRLA, EOMI.   Neck: no LAD, mass, or thyromegaly. CV: RRR (rate 55 by me), no m/r/g LUNGS: CTA bilat, nonlabored. NEURO: no tremor or tics noted on observation.  Coordination intact. CN 2-12 grossly intact bilaterally, strength 5/5 in all extremeties.  No ataxia.   LABS:  Lab Results  Component Value Date   TSH 1.18 04/08/2016   Lab Results  Component Value Date   WBC 4.3 04/08/2016   HGB 11.9 (L) 04/08/2016   HCT 35.7 (L) 04/08/2016   MCV 84.6 04/08/2016   PLT 232.0 04/08/2016   Lab Results  Component Value Date   CREATININE 0.66 04/08/2016   BUN 9 04/08/2016   NA 140 04/08/2016   K 4.4 04/08/2016   CL 104 04/08/2016   CO2 30 04/08/2016   Lab Results  Component Value Date   ALT 35 04/08/2016   AST 31 04/08/2016   ALKPHOS 85 04/08/2016   BILITOT 0.3 04/08/2016   Lab Results  Component Value Date   CHOL 183 04/08/2016   Lab Results  Component  Value Date   HDL 59.20 04/08/2016   Lab Results  Component Value Date   LDLCALC 112 (H) 04/08/2016   Lab Results  Component Value Date   TRIG 59.0 04/08/2016   Lab Results  Component Value Date   CHOLHDL 3 04/08/2016   Lab Results  Component Value Date   HGBA1C 5.4 08/15/2014    IMPRESSION AND PLAN:  1) Migraine syndrome: responding very well to inderal LA  qhs. Side effect of excessive subjective feeling of heat--pt is willing to put up with this. HR into 50s but pt has no symptoms, no low BP. She has maxalt on hand to try if she does get a migraine.  2) Vit D deficiency: continue high dose vit D replacement for total of 12 weeks. Continue 2000 U vit D otc as well. We'll recheck vit D level at f/u in 4 mo.  An After Visit Summary was printed and given to the patient.  FOLLOW UP: Return in about 4 months (around 09/11/2016) for f/u migraines + vit D def.  Signed:  Santiago Bumpers, MD           05/12/2016

## 2016-05-12 NOTE — Progress Notes (Signed)
Pre visit review using our clinic review tool, if applicable. No additional management support is needed unless otherwise documented below in the visit note. 

## 2016-06-02 ENCOUNTER — Telehealth: Payer: Self-pay | Admitting: *Deleted

## 2016-06-02 DIAGNOSIS — G4700 Insomnia, unspecified: Secondary | ICD-10-CM

## 2016-06-02 MED ORDER — ZOLPIDEM TARTRATE 5 MG PO TABS: 5.0000 mg | ORAL_TABLET | Freq: Every evening | ORAL | 5 refills | Status: DC | PRN

## 2016-06-02 MED ORDER — BENZONATATE 100 MG PO CAPS: 200.0000 mg | ORAL_CAPSULE | Freq: Three times a day (TID) | ORAL | 1 refills | Status: DC | PRN

## 2016-06-02 NOTE — Telephone Encounter (Signed)
Rx faxed

## 2016-06-02 NOTE — Telephone Encounter (Signed)
[facility].  RF request for alprazolam LOV: 05/12/16 Next ov: 07/25/16 Last written: 10/08/15 #30 w/ 5RF  Also received refill request for benzonate  capsules. This is not an active medication.  Please advise. Thanks.

## 2016-06-02 NOTE — Telephone Encounter (Signed)
RF'd ambien (not alprazolam) as per pt request. Also eRx'd benzonatate as per pt request.

## 2016-07-03 ENCOUNTER — Other Ambulatory Visit: Payer: Self-pay | Admitting: Family Medicine

## 2016-07-25 ENCOUNTER — Encounter: Payer: Self-pay | Admitting: Family Medicine

## 2016-07-25 ENCOUNTER — Ambulatory Visit (INDEPENDENT_AMBULATORY_CARE_PROVIDER_SITE_OTHER): Payer: 59 | Admitting: Family Medicine

## 2016-07-25 VITALS — BP 109/65 | HR 58 | Temp 97.5°F | Resp 16 | Ht 69.0 in | Wt 198.5 lb

## 2016-07-25 DIAGNOSIS — F5105 Insomnia due to other mental disorder: Secondary | ICD-10-CM

## 2016-07-25 DIAGNOSIS — E559 Vitamin D deficiency, unspecified: Secondary | ICD-10-CM | POA: Diagnosis not present

## 2016-07-25 DIAGNOSIS — F409 Phobic anxiety disorder, unspecified: Secondary | ICD-10-CM | POA: Diagnosis not present

## 2016-07-25 DIAGNOSIS — F411 Generalized anxiety disorder: Secondary | ICD-10-CM | POA: Diagnosis not present

## 2016-07-25 DIAGNOSIS — F339 Major depressive disorder, recurrent, unspecified: Secondary | ICD-10-CM

## 2016-07-25 MED ORDER — CITALOPRAM HYDROBROMIDE 20 MG PO TABS: 20.0000 mg | ORAL_TABLET | Freq: Every day | ORAL | 1 refills | Status: DC

## 2016-07-25 MED ORDER — LORAZEPAM 2 MG PO TABS: 2.0000 mg | ORAL_TABLET | Freq: Three times a day (TID) | ORAL | 1 refills | Status: DC | PRN

## 2016-07-25 NOTE — Progress Notes (Signed)
OFFICE VISIT  07/25/2016   CC:  Chief Complaint  Patient presents with  . Follow-up    Anxiety and Insomnia   HPI:    Patient is a 51 y.o.  female who presents for f/u insomnia and anxiety and vit D deficiency. Last visit 4 mo ago she was dx'd with vit D def, took 12 weeks of high dose vit D replacement but sounds like not consecutive weeks, has not been on any daily vit D supplement OTC.  Insomnia: zolpidem helps some nights, other nights not at all.  When stress level is up it doesn't work.   Generalized anxiety level still constantly pretty high, occ peaks even higher but no absolute true panic attacks.  Lots of things contributing: mom with dementia, son in [employer] [place], pt volunteers and works a lot.   Has not had any ativan to take lately, and when she did take them in the past she always had to take 2 of the 1mg  ativan to feel improvement.   Mood has been angry/frustrated.  Some crying spells.  Some hopelessness, some guilt. Mood is mildly depressed.  She never takes a break to do things that are fun with her, no stress relieving activity.  No SI or HI. She has been resistant to taking antidepressant in the past but is willing to try at this point in time.  ROS: no palpitations, no SOB, no CP. HA's still doing much better since getting on propranolol prophylaxis.  No n/v/d or abd pain.  Past Medical History:  Diagnosis Date  . Anemia   . Anxiety   . History of hematuria   . History of sexually transmitted disease    trich, gonorrhea, lice  . Insomnia   . Migraine syndrome   . Seasonal allergies   . Urinary tract bacterial infections   . Vaginal dryness   . Vitamin D deficiency    High dose Vit D replacement started 04/2016    Past Surgical History:  Procedure Laterality Date  . CESAREAN SECTION    . EYE SURGERY     Lasix  . TUBAL LIGATION    . TYMPANOPLASTY  2010    Outpatient Medications Prior to Visit  Medication Sig Dispense Refill  . Multiple  Vitamin (MULTIVITAMIN) tablet Take 1 tablet by mouth daily.      . naproxen (NAPROSYN) 500 MG tablet 1 tab po bid with food prn musculoskeletal pain 30 tablet 2  . propranolol ER (INDERAL LA) 80 MG 24 hr capsule 1 tab po qhs 90 capsule 1  . rizatriptan (MAXALT) 10 MG tablet Take 1 tablet (10 mg total) by mouth as needed for migraine. May repeat in 2 hours if needed.  Max of 30 mg per 24h. 10 tablet 1  . Vitamin D, Ergocalciferol, (DRISDOL) 50000 units CAPS capsule Take 1 capsule (50,000 Units total) by mouth every 7 (seven) days. 12 capsule 0  . zolpidem (AMBIEN) 5 MG tablet Take 1 tablet (5 mg total) by mouth at bedtime as needed for sleep. 30 tablet 5  . benzonatate (TESSALON) 100 MG capsule Take 2 capsules (200 mg total) by mouth 3 (three) times daily as needed for cough. (Patient not taking: Reported on 07/25/2016) 30 capsule 1  . LORazepam (ATIVAN) 1 MG tablet Take 1 tablet (1 mg total) by mouth daily as needed for anxiety. (Patient not taking: Reported on 07/25/2016) 30 tablet 0   No facility-administered medications prior to visit.     Allergies  Allergen Reactions  .  Sulfonamide Derivatives     REACTION: Hives  . Penicillins Rash    ROS As per HPI  PE: Blood pressure 109/65, pulse (!) 58, temperature 97.5 F (36.4 C), temperature source Oral, resp. rate 16, height 5\' 9"  (1.753 m), weight 198 lb 8 oz (90 kg), SpO2 98 %. Gen: Alert, well appearing.  Patient is oriented to person, place, time, and situation. AFFECT: pleasant, lucid thought and speech. No further exam today.  LABS:    Chemistry      Component Value Date/Time   NA 140 04/08/2016 1105   K 4.4 04/08/2016 1105   CL 104 04/08/2016 1105   CO2 30 04/08/2016 1105   BUN 9 04/08/2016 1105   CREATININE 0.66 04/08/2016 1105   CREATININE 0.74 02/09/2014 0957      Component Value Date/Time   CALCIUM 9.0 04/08/2016 1105   ALKPHOS 85 04/08/2016 1105   AST 31 04/08/2016 1105   ALT 35 04/08/2016 1105   BILITOT 0.3  04/08/2016 1105     Lab Results  Component Value Date   WBC 4.3 04/08/2016   HGB 11.9 (L) 04/08/2016   HCT 35.7 (L) 04/08/2016   MCV 84.6 04/08/2016   PLT 232.0 04/08/2016   Lab Results  Component Value Date   TSH 1.18 04/08/2016    IMPRESSION AND PLAN:  1) GAD and depression: start citalopram 20mg  qd.  Therapeutic expectations and side effect profile of medication discussed today.  Patient's questions answered. Restart lorazepam at higher dosing: 2mg  tab, 1 tid prn.  Therapeutic expectations and side effect profile of medication discussed today.  Patient's questions answered.  2) Insomnia: mostly anxiety/stress-related.  Continue zolpidem 5mg  qhs prn. We'll look for her sleep to improve the longer she is on citalopram.  3) Vit D def: recheck Vit D level today. Adjust vit D supplement as appropriate.  An After Visit Summary was printed and given to the patient.  FOLLOW UP: Return in about 4 weeks (around 08/22/2016) for f/u anx/mood/insom.  Signed:  Santiago Bumpers, MD           07/25/2016

## 2016-07-26 LAB — VITAMIN D 25 HYDROXY (VIT D DEFICIENCY, FRACTURES): Vit D, 25-Hydroxy: 28 ng/mL — ABNORMAL LOW (ref 30–100)

## 2016-07-28 ENCOUNTER — Other Ambulatory Visit: Payer: Self-pay | Admitting: Family Medicine

## 2016-07-28 MED ORDER — VITAMIN D (ERGOCALCIFEROL) 1.25 MG (50000 UNIT) PO CAPS: ORAL_CAPSULE | ORAL | 1 refills | Status: DC

## 2016-08-27 ENCOUNTER — Ambulatory Visit: Payer: 59 | Admitting: Family Medicine

## 2016-09-23 ENCOUNTER — Other Ambulatory Visit: Payer: Self-pay | Admitting: Family Medicine

## 2016-09-24 NOTE — Telephone Encounter (Signed)
Pt advised and voiced understanding.  Apt made for 10/08/16 at 8:45am.

## 2016-09-24 NOTE — Telephone Encounter (Signed)
[facility]  RF request for celexa LOV: 07/25/16 Next ov: None Last written: 07/25/16 #30 w/ 1RF  Will send Rx for #30 w/ 0RF. Pt need to schedule f/u apt for more refills.

## 2016-10-08 ENCOUNTER — Ambulatory Visit (INDEPENDENT_AMBULATORY_CARE_PROVIDER_SITE_OTHER): Payer: 59 | Admitting: Family Medicine

## 2016-10-08 ENCOUNTER — Encounter: Payer: Self-pay | Admitting: Family Medicine

## 2016-10-08 VITALS — BP 104/69 | HR 49 | Temp 97.4°F | Resp 16 | Ht 69.0 in | Wt 199.5 lb

## 2016-10-08 DIAGNOSIS — F419 Anxiety disorder, unspecified: Secondary | ICD-10-CM | POA: Diagnosis not present

## 2016-10-08 DIAGNOSIS — F5105 Insomnia due to other mental disorder: Secondary | ICD-10-CM

## 2016-10-08 DIAGNOSIS — M542 Cervicalgia: Secondary | ICD-10-CM | POA: Diagnosis not present

## 2016-10-08 DIAGNOSIS — E559 Vitamin D deficiency, unspecified: Secondary | ICD-10-CM | POA: Diagnosis not present

## 2016-10-08 DIAGNOSIS — F411 Generalized anxiety disorder: Secondary | ICD-10-CM

## 2016-10-08 DIAGNOSIS — G43909 Migraine, unspecified, not intractable, without status migrainosus: Secondary | ICD-10-CM

## 2016-10-08 DIAGNOSIS — L659 Nonscarring hair loss, unspecified: Secondary | ICD-10-CM | POA: Diagnosis not present

## 2016-10-08 LAB — VITAMIN D 25 HYDROXY (VIT D DEFICIENCY, FRACTURES): VITD: 36.78 ng/mL (ref 30.00–100.00)

## 2016-10-08 MED ORDER — CYCLOBENZAPRINE HCL 10 MG PO TABS
10.0000 mg | ORAL_TABLET | Freq: Three times a day (TID) | ORAL | 0 refills | Status: DC | PRN
Start: 2016-10-08 — End: 2018-04-19

## 2016-10-08 NOTE — Progress Notes (Signed)
OFFICE VISIT  10/08/2016   CC:  Chief Complaint  Patient presents with  . Follow-up    RCI, pt is fasting.    HPI:    Patient is a 51 y.o. African-American female who presents for f/u GAD with anxiety related insomnia. Also migraine HA syndrome and vit D deficiency. Also wants to discuss neck pain and hair loss.  Migraine meds helping great--not having any HAs.  Feels "heat" after she takes the med, but she is willing to accept this side effect.  GAD with insomnia: not doing any better.  Has been taking lorazepam 2mg  tid.  She is working on staying active and getting out of the house more.  Failed trial of antidepressant in the past.  Doesn't want to try another one. Still having primary and maintenance insomnia, even with ambien and ativan 2mg  qhs.  No daytime naps.  Avoiding caffeine.  On 07/25/16 I increased her vit D dosing to 50K units twice per week, due for recheck vit D today.  Hair loss noted: round patch about 3 cm diameter, no sparse hair loss.    Neck hurts on L side, down the side and over top of trapezius onto top of R shoulder.  No trauma or preceding physical strain. Onset 5 days ago, woke up and couldn't turn head.  Worse when turning head to R.  Feels tight.   No radiation of pain down arms, no paresthesias.  Tried heat and ice--helped some.  Has been taking ibuprofen, 2 bid mostly prn.  Past Medical History:  Diagnosis Date  . Anemia   . Anxiety   . History of hematuria   . History of sexually transmitted disease    trich, gonorrhea, lice  . Insomnia   . Migraine syndrome   . Seasonal allergies   . Urinary tract bacterial infections   . Vaginal dryness   . Vitamin D deficiency    High dose Vit D replacement started 04/2016    Past Surgical History:  Procedure Laterality Date  . CESAREAN SECTION    . EYE SURGERY     Lasix  . TUBAL LIGATION    . TYMPANOPLASTY  2010    Outpatient Medications Prior to Visit  Medication Sig Dispense Refill  .  LORazepam (ATIVAN) 2 MG tablet Take 1 tablet (2 mg total) by mouth every 8 (eight) hours as needed for anxiety. 60 tablet 1  . Multiple Vitamin (MULTIVITAMIN) tablet Take 1 tablet by mouth daily.      . propranolol ER (INDERAL LA) 80 MG 24 hr capsule 1 tab po qhs 90 capsule 1  . rizatriptan (MAXALT) 10 MG tablet Take 1 tablet (10 mg total) by mouth as needed for migraine. May repeat in 2 hours if needed.  Max of 30 mg per 24h. 10 tablet 1  . Vitamin D, Ergocalciferol, (DRISDOL) 50000 units CAPS capsule 1 tab on mondays and fridays each week 24 capsule 1  . zolpidem (AMBIEN) 5 MG tablet Take 1 tablet (5 mg total) by mouth at bedtime as needed for sleep. 30 tablet 5  . citalopram (CELEXA) 20 MG tablet TAKE 1 TABLET BY MOUTH EVERY DAY (Patient not taking: Reported on 10/08/2016) 30 tablet 0  . naproxen (NAPROSYN) 500 MG tablet 1 tab po bid with food prn musculoskeletal pain (Patient not taking: Reported on 10/08/2016) 30 tablet 2   No facility-administered medications prior to visit.     Allergies  Allergen Reactions  . Sulfonamide Derivatives     REACTION:  Hives  . Penicillins Rash    ROS As per HPI  PE: Blood pressure 104/69, pulse (!) 49, temperature (!) 97.4 F (36.3 C), temperature source Oral, resp. rate 16, height 5\' 9"  (1.753 m), weight 199 lb 8 oz (90.5 kg), SpO2 99 %. Gen: Alert, well appearing.  Patient is oriented to person, place, time, and situation. AFFECT: pleasant, lucid thought and speech. Neck: some mild muscular tTP over L side of C spine region extending down over top of trapezius on L.  No AC joint tenderness. No shoulder girdle tenderness.  ROM of C spine: stiff with all motions but only impaired significantly with rotation to L and bending to L. UE strength 5/5 prox/dist bilat.  Sensation intact.  UE DTRs: trace triceps, 2+ biceps, trace brachioradialis BILAT.  LABS:   Lab Results  Component Value Date   TSH 1.18 04/08/2016   Lab Results  Component Value Date    WBC 4.3 04/08/2016   HGB 11.9 (L) 04/08/2016   HCT 35.7 (L) 04/08/2016   MCV 84.6 04/08/2016   PLT 232.0 04/08/2016   Lab Results  Component Value Date   CREATININE 0.66 04/08/2016   BUN 9 04/08/2016   NA 140 04/08/2016   K 4.4 04/08/2016   CL 104 04/08/2016   CO2 30 04/08/2016   Lab Results  Component Value Date   ALT 35 04/08/2016   AST 31 04/08/2016   ALKPHOS 85 04/08/2016   BILITOT 0.3 04/08/2016   Lab Results  Component Value Date   CHOL 183 04/08/2016   Lab Results  Component Value Date   HDL 59.20 04/08/2016   Lab Results  Component Value Date   LDLCALC 112 (H) 04/08/2016   Lab Results  Component Value Date   TRIG 59.0 04/08/2016   Lab Results  Component Value Date   CHOLHDL 3 04/08/2016   Lab Results  Component Value Date   HGBA1C 5.4 08/15/2014   Vit D = 28 on 07/25/16  IMPRESSION AND PLAN:  1) Acute neck strain: likely from malpositioning during sleep. Recommendations: Take THREE over the counter ibuprofen twice a day with a meal for 10 days. Continue ice and heat---do gentle range of motion stretching of neck twice a day. Cyclobenzaprine 10mg  tid prn.  Therapeutic expectations and side effect profile of medication discussed today.  Patient's questions answered.  2) Vit D def: recheck vit D level today since increase in dose to 50K twice per week.  Of note, sounds like she has been a little less than 100% compliance with this dosing.  3) Migraine syndrome: doing very well.  The current medical regimen is effective;  continue present plan and medications.  4) GAD with anxiety: she is hanging in there although she deals with this pretty constantly.  She'll continue to try to keep herself busy, try to maintain good sleep hygiene, and continue ativan 2 mg tid and hs zolpidem 5mg . She failed a trial of antidepressant and does not want to try another at this time.  5) alopecia areata--reassured pt--hopefully just transient.  An After Visit Summary  was printed and given to the patient.  FOLLOW UP: Return in about 6 months (around 04/09/2017) for annual CPE (fasting).  Signed:  Santiago BumpersPhil Daemyn Gariepy, MD           10/08/2016

## 2016-10-08 NOTE — Patient Instructions (Signed)
Take THREE over the counter ibuprofen twice a day with a meal for 10 days. Continue ice and heat---do gentle range of motion stretching of neck twice a day. Hope this feels better soon!

## 2016-10-27 ENCOUNTER — Ambulatory Visit (INDEPENDENT_AMBULATORY_CARE_PROVIDER_SITE_OTHER): Payer: 59 | Admitting: Family Medicine

## 2016-10-27 ENCOUNTER — Ambulatory Visit: Payer: 59 | Admitting: Family Medicine

## 2016-10-27 ENCOUNTER — Encounter: Payer: Self-pay | Admitting: Family Medicine

## 2016-10-27 VITALS — Temp 98.4°F | Resp 16 | Ht 69.0 in | Wt 201.8 lb

## 2016-10-27 DIAGNOSIS — R42 Dizziness and giddiness: Secondary | ICD-10-CM

## 2016-10-27 DIAGNOSIS — R0789 Other chest pain: Secondary | ICD-10-CM | POA: Diagnosis not present

## 2016-10-27 DIAGNOSIS — R5383 Other fatigue: Secondary | ICD-10-CM

## 2016-10-27 DIAGNOSIS — M940 Chondrocostal junction syndrome [Tietze]: Secondary | ICD-10-CM

## 2016-10-27 DIAGNOSIS — B349 Viral infection, unspecified: Secondary | ICD-10-CM | POA: Diagnosis not present

## 2016-10-27 LAB — CBC WITH DIFFERENTIAL/PLATELET
BASOS ABS: 0 10*3/uL (ref 0.0–0.1)
Basophils Relative: 1 % (ref 0.0–3.0)
EOS ABS: 0.1 10*3/uL (ref 0.0–0.7)
Eosinophils Relative: 2.2 % (ref 0.0–5.0)
HCT: 35.6 % — ABNORMAL LOW (ref 36.0–46.0)
Hemoglobin: 11.7 g/dL — ABNORMAL LOW (ref 12.0–15.0)
LYMPHS ABS: 1.7 10*3/uL (ref 0.7–4.0)
Lymphocytes Relative: 39.3 % (ref 12.0–46.0)
MCHC: 32.9 g/dL (ref 30.0–36.0)
MCV: 85.9 fl (ref 78.0–100.0)
MONO ABS: 0.3 10*3/uL (ref 0.1–1.0)
Monocytes Relative: 7.2 % (ref 3.0–12.0)
NEUTROS PCT: 50.3 % (ref 43.0–77.0)
Neutro Abs: 2.1 10*3/uL (ref 1.4–7.7)
Platelets: 244 10*3/uL (ref 150.0–400.0)
RBC: 4.15 Mil/uL (ref 3.87–5.11)
RDW: 13.4 % (ref 11.5–15.5)
WBC: 4.3 10*3/uL (ref 4.0–10.5)

## 2016-10-27 NOTE — Progress Notes (Signed)
OFFICE VISIT  10/27/2016   CC:  Chief Complaint  Patient presents with  . Fatigue  . Dizziness    HPI:    Patient is a 51 y.o. African-American female who presents for lightheadedness/not feeling well onset this morning. Describes disequilibrium sensation, noted when she went from sitting to standing position. Since that time she has adjusted the way she gets up (slowly) and the disequilibrium is still present but not as bad. Last few days energy has been down, has to take deep breaths more often.  Some HA lately.  Admits to "light" left sided chest ache last several days, occurs sitting OR moving around.  Nothing makes it worse, nothing makes it better.  The pain lasts 10-15 min, or at least that is the duration of time she is aware of it since it is a pretty mild pain.  Mild feeling of SOB with this.  No diaphoresis.  Left arm discomfort noted chronically--wants to "shake it out"  No worse lately with CP (?).  She does not exercise. Haze to her vision, no double vision.  Feels a fog in head.  Last week had ST, pink eye, URI --all sx's resolved completely.  No n/v/abd pain.  No dysuria, urinary frequency, or urgency.  No diarrhea or constipation.  No rash. Whole body aches chronically--for about the last 1 yr. No fever. No known tick bites.  No vag bleeding, no melena, no hematochezia.  Has chronic high level of stress.  Past Medical History:  Diagnosis Date  . Anemia   . Anxiety   . History of hematuria   . History of sexually transmitted disease    trich, gonorrhea, lice  . Insomnia   . Migraine syndrome   . Seasonal allergies   . Urinary tract bacterial infections   . Vaginal dryness   . Vitamin D deficiency    Vit D into normal range only with twice weekly 50K U vit D tab.    Past Surgical History:  Procedure Laterality Date  . CESAREAN SECTION    . EYE SURGERY     Lasix  . TUBAL LIGATION    . TYMPANOPLASTY  2010    Outpatient Medications Prior to Visit   Medication Sig Dispense Refill  . cyclobenzaprine (FLEXERIL) 10 MG tablet Take 1 tablet (10 mg total) by mouth 3 (three) times daily as needed for muscle spasms. 30 tablet 0  . LORazepam (ATIVAN) 2 MG tablet Take 1 tablet (2 mg total) by mouth every 8 (eight) hours as needed for anxiety. 60 tablet 1  . Multiple Vitamin (MULTIVITAMIN) tablet Take 1 tablet by mouth daily.      . propranolol ER (INDERAL LA) 80 MG 24 hr capsule 1 tab po qhs 90 capsule 1  . rizatriptan (MAXALT) 10 MG tablet Take 1 tablet (10 mg total) by mouth as needed for migraine. May repeat in 2 hours if needed.  Max of 30 mg per 24h. 10 tablet 1  . Vitamin D, Ergocalciferol, (DRISDOL) 50000 units CAPS capsule 1 tab on mondays and fridays each week 24 capsule 1  . zolpidem (AMBIEN) 5 MG tablet Take 1 tablet (5 mg total) by mouth at bedtime as needed for sleep. 30 tablet 5   No facility-administered medications prior to visit.     Allergies  Allergen Reactions  . Sulfonamide Derivatives     REACTION: Hives  . Penicillins Rash    ROS As per HPI  PE: Blood pressure 120/81, pulse (!) 53, temperature 98.4  F (36.9 C), temperature source Oral, resp. rate 16, height  (1.753 m), weight 201 lb 12 oz (91.5 kg), SpO2 98 %.  Orthostatics: supine 68, 120/74.  Sitting: 68, 122/80, standing: 72, 130/80. Gen: Alert, tired appearing but NAD.  Patient is oriented to person, place, time, and situation. AFFECT: pleasant, lucid thought and speech. NFA:OZHY: no injection, icteris, swelling, or exudate.  EOMI, PERRLA. Mouth: lips without lesion/swelling.  Oral mucosa pink and moist. Oropharynx without erythema, exudate, or swelling.  Neck - No masses or thyromegaly or limitation in range of motion CV: RRR, no m/r/g.   LUNGS: CTA bilat, nonlabored resps, good aeration in all lung fields. Chest wall: TTP in upper 1/2 at the costochondral junction, L>R. EXT: no clubbing, cyanosis, or edema.    LABS:    Chemistry      Component  Value Date/Time   NA 140 04/08/2016 1105   K 4.4 04/08/2016 1105   CL 104 04/08/2016 1105   CO2 30 04/08/2016 1105   BUN 9 04/08/2016 1105   CREATININE 0.66 04/08/2016 1105   CREATININE 0.74 02/09/2014 0957      Component Value Date/Time   CALCIUM 9.0 04/08/2016 1105   ALKPHOS 85 04/08/2016 1105   AST 31 04/08/2016 1105   ALT 35 04/08/2016 1105   BILITOT 0.3 04/08/2016 1105     Lab Results  Component Value Date   WBC 4.3 04/08/2016   HGB 11.9 (L) 04/08/2016   HCT 35.7 (L) 04/08/2016   MCV 84.6 04/08/2016   PLT 232.0 04/08/2016   Lab Results  Component Value Date   TSH 1.18 04/08/2016   12 lead EKG today:  NSR, rate 60, TWI in V1, otherwise no TWI.  No Q waves or ST wave elevation or depression. No ectopy.  Intervals and duration normal.  No hypertrophy.  Low voltage aVL. Compared to EKG 03/2016 the TWI in V1 is new.  However, TWI in V1 was present on EKG 01/2015.  Overall, normal EKG.  IMPRESSION AND PLAN:  Suspect viral syndrome, specifically costochondritis. Encouraged good hydration with clear fluids, decrease intake of colas/caffeinated drinks, rest, may take aleve  bid with food x 7d--samples given.   Check CBC w/diff and CMET today.  An After Visit Summary was printed and given to the patient.  Spent 30 min with pt today, with >50% of this time spent in counseling and care coordination regarding the above problems.  FOLLOW UP: Return if symptoms worsen or fail to improve.  Signed:  Santiago Bumpers, MD           10/27/2016

## 2016-10-27 NOTE — Addendum Note (Signed)
Addended by: Eulah Pont on: 10/27/2016 03:42 PM   Modules accepted: Orders

## 2016-10-27 NOTE — Patient Instructions (Signed)
Take aleve, 2 tabs twice per day with food x 7d. Drink clear fluids (65 oz per day) and decrease intake of colas/caffeinated drinks. REST!

## 2016-10-28 LAB — IRON, TOTAL/TOTAL IRON BINDING CAP
%SAT: 22 % (ref 11–50)
IRON: 61 ug/dL (ref 45–160)
TIBC: 278 ug/dL (ref 250–450)

## 2016-10-28 LAB — COMPREHENSIVE METABOLIC PANEL
AG Ratio: 1.1 (calc) (ref 1.0–2.5)
ALKALINE PHOSPHATASE (APISO): 68 U/L (ref 33–130)
ALT: 9 U/L (ref 6–29)
AST: 13 U/L (ref 10–35)
Albumin: 3.7 g/dL (ref 3.6–5.1)
BUN: 11 mg/dL (ref 7–25)
CALCIUM: 8.8 mg/dL (ref 8.6–10.4)
CO2: 25 mmol/L (ref 20–32)
Chloride: 105 mmol/L (ref 98–110)
Creat: 0.68 mg/dL (ref 0.50–1.05)
Globulin: 3.3 g/dL (calc) (ref 1.9–3.7)
Glucose, Bld: 88 mg/dL (ref 65–99)
Potassium: 4.3 mmol/L (ref 3.5–5.3)
Sodium: 139 mmol/L (ref 135–146)
TOTAL PROTEIN: 7 g/dL (ref 6.1–8.1)
Total Bilirubin: 0.3 mg/dL (ref 0.2–1.2)

## 2016-10-28 LAB — TEST AUTHORIZATION

## 2016-10-28 LAB — FERRITIN: FERRITIN: 41 ng/mL (ref 10–232)

## 2016-10-28 LAB — VITAMIN B12: Vitamin B-12: 450 pg/mL (ref 200–1100)

## 2016-12-01 ENCOUNTER — Other Ambulatory Visit: Payer: Self-pay | Admitting: *Deleted

## 2016-12-01 DIAGNOSIS — G4700 Insomnia, unspecified: Secondary | ICD-10-CM

## 2016-12-01 MED ORDER — ZOLPIDEM TARTRATE 5 MG PO TABS: 5.0000 mg | ORAL_TABLET | Freq: Every evening | ORAL | 5 refills | Status: DC | PRN

## 2016-12-01 NOTE — Telephone Encounter (Signed)
Rx faxed

## 2016-12-01 NOTE — Telephone Encounter (Signed)
[facility]  RF request for zolpidem LOV: 10/08/16 Next ov: 04/09/17 Last written: 06/02/16 #30 w/ 5RF  Please advise. Thanks.

## 2016-12-20 ENCOUNTER — Other Ambulatory Visit: Payer: Self-pay | Admitting: Family Medicine

## 2017-01-05 ENCOUNTER — Other Ambulatory Visit: Payer: Self-pay | Admitting: *Deleted

## 2017-01-05 MED ORDER — LORAZEPAM 2 MG PO TABS: 2.0000 mg | ORAL_TABLET | Freq: Three times a day (TID) | ORAL | 1 refills | Status: DC | PRN

## 2017-01-05 NOTE — Telephone Encounter (Signed)
[facility]  RF request for lorazepam LOV: 10/08/16 Next ov: 04/09/17 Last written: 07/25/16 #60 w/ 1RF  Please advise. Thanks.

## 2017-01-06 NOTE — Telephone Encounter (Signed)
Rx faxed

## 2017-03-27 ENCOUNTER — Other Ambulatory Visit: Payer: Self-pay | Admitting: Obstetrics and Gynecology

## 2017-03-27 DIAGNOSIS — N6452 Nipple discharge: Secondary | ICD-10-CM

## 2017-03-31 ENCOUNTER — Encounter: Payer: Self-pay | Admitting: Family Medicine

## 2017-04-01 ENCOUNTER — Ambulatory Visit
Admission: RE | Admit: 2017-04-01 | Discharge: 2017-04-01 | Disposition: A | Discharge status: Home / Self Care | Payer: 59 | Source: Ambulatory Visit | Attending: Obstetrics and Gynecology | Admitting: Obstetrics and Gynecology

## 2017-04-01 DIAGNOSIS — N6452 Nipple discharge: Secondary | ICD-10-CM

## 2017-04-09 ENCOUNTER — Encounter: Payer: 59 | Admitting: Family Medicine

## 2017-04-20 ENCOUNTER — Other Ambulatory Visit: Payer: Self-pay | Admitting: Family Medicine

## 2017-04-20 LAB — HM PAP SMEAR: HM Pap smear: NEGATIVE

## 2017-04-20 NOTE — Telephone Encounter (Signed)
RF request for vistamin D LOV: 10/08/16 Next ov: None Last written: 07/28/16 #24 w/ 1RF  Please advise. Thanks.

## 2017-04-21 ENCOUNTER — Encounter: Payer: Self-pay | Admitting: Family Medicine

## 2017-06-22 ENCOUNTER — Other Ambulatory Visit: Payer: Self-pay

## 2017-06-22 MED ORDER — LORAZEPAM 2 MG PO TABS: 2.0000 mg | ORAL_TABLET | Freq: Three times a day (TID) | ORAL | 0 refills | Status: DC | PRN

## 2017-06-22 NOTE — Telephone Encounter (Signed)
Due to latest prescribing guidelines from [facility] board, I must see her in office every 6 months in order to follow up her use of her lorazepam. Will rx #60, no RF--pls have her make appt some time in the next 6 weeks.-thx

## 2017-06-22 NOTE — Telephone Encounter (Signed)
Patient notified and verbalized understanding. Appointment scheduled 07/02/17.

## 2017-07-02 ENCOUNTER — Encounter: Payer: Self-pay | Admitting: Family Medicine

## 2017-07-02 ENCOUNTER — Ambulatory Visit (INDEPENDENT_AMBULATORY_CARE_PROVIDER_SITE_OTHER): Payer: 59 | Admitting: Family Medicine

## 2017-07-02 VITALS — BP 87/52 | HR 60 | Temp 98.1°F | Resp 16 | Ht 69.0 in | Wt 195.4 lb

## 2017-07-02 DIAGNOSIS — F409 Phobic anxiety disorder, unspecified: Secondary | ICD-10-CM | POA: Diagnosis not present

## 2017-07-02 DIAGNOSIS — F411 Generalized anxiety disorder: Secondary | ICD-10-CM | POA: Diagnosis not present

## 2017-07-02 DIAGNOSIS — F5105 Insomnia due to other mental disorder: Secondary | ICD-10-CM | POA: Diagnosis not present

## 2017-07-02 DIAGNOSIS — E559 Vitamin D deficiency, unspecified: Secondary | ICD-10-CM | POA: Diagnosis not present

## 2017-07-02 DIAGNOSIS — G43909 Migraine, unspecified, not intractable, without status migrainosus: Secondary | ICD-10-CM

## 2017-07-02 LAB — VITAMIN D 25 HYDROXY (VIT D DEFICIENCY, FRACTURES): VITD: 38.36 ng/mL (ref 30.00–100.00)

## 2017-07-02 MED ORDER — LORAZEPAM 2 MG PO TABS: 2.0000 mg | ORAL_TABLET | Freq: Three times a day (TID) | ORAL | 5 refills | Status: DC | PRN

## 2017-07-02 NOTE — Progress Notes (Signed)
OFFICE VISIT  07/02/2017   CC:  Chief Complaint  Patient presents with  . Follow-up    RCI   HPI:    Patient is a 52 y.o. African-American female who presents for f/u GAD with anxiety-related insomnia, migraine HA syndrome, and vit D deficiency.  Vit D def: has been on vit D 50K twice per week dosing for almost a year now.  Last vit D level was low-normal range. Dose not changed.  She stopped taking propranolol and maxalt about 6 mo ago b/c she felt they weren't helping. She stopped all her caffeine intake at that time and has not had a migraine HA since then! Drinking more water.    Sleeping a little better.  Occ has to take 2 ambien hs still.    Anxiety: says she feels like this is better.  Moving around more/more active, taking MVI x 2 tabs qd. Still has some situational increases in anxiety but these are pretty short lived. Takes lorazepam  only once daily most days, occ 2 per day.  She is now on an oral contraceptive for her hot flashes and this is helping GREAT.  ROS: only occ orthostatic lightheadedness.  No palpitations, no CP, no SOB, no rash, no fevers, no polyuria or polydipsia.    Past Medical History:  Diagnosis Date  . Anemia   . Anxiety   . Breast abscess 03/2017   Dx'd by pt's GYN  . History of hematuria   . History of sexually transmitted disease    trich, gonorrhea, lice  . Insomnia   . Migraine syndrome   . Seasonal allergies   . Urinary tract bacterial infections   . Vaginal dryness   . Vitamin D deficiency    Vit D into normal range only with twice weekly 50K U vit D tab.    Past Surgical History:  Procedure Laterality Date  . CESAREAN SECTION    . EYE SURGERY     Lasix  . TUBAL LIGATION    . TYMPANOPLASTY  2010    Outpatient Medications Prior to Visit  Medication Sig Dispense Refill  . cyclobenzaprine (FLEXERIL) 10 MG tablet Take 1 tablet (10 mg total) by mouth 3 (three) times daily as needed for muscle spasms. 30 tablet 0  .  JUNEL 1/20 1-20 MG-MCG tablet Take 1 tablet by mouth daily.  0  . Multiple Vitamin (MULTIVITAMIN) tablet Take 1 tablet by mouth daily.      . Vitamin D, Ergocalciferol, (DRISDOL) 50000 units CAPS capsule TAKE 1 CAPSULE ON MONDAYS AND FRIDAYS OF EACH WEEK 24 capsule 1  . zolpidem (AMBIEN) 5 MG tablet Take 1 tablet (5 mg total) by mouth at bedtime as needed for sleep. 30 tablet 5  . LORazepam (ATIVAN) 2 MG tablet Take 1 tablet (2 mg total) by mouth every 8 (eight) hours as needed for anxiety. 60 tablet 0  . propranolol ER (INDERAL LA) 80 MG 24 hr capsule TAKE 1 CAPSULE AT BEDTIME (Patient not taking: Reported on 07/02/2017) 90 capsule 1  . rizatriptan (MAXALT) 10 MG tablet Take 1 tablet (10 mg total) by mouth as needed for migraine. May repeat in 2 hours if needed.  Max of 30 mg per 24h. (Patient not taking: Reported on 07/02/2017) 10 tablet 1   No facility-administered medications prior to visit.     Allergies  Allergen Reactions  . Sulfonamide Derivatives     REACTION: Hives  . Penicillins Rash    ROS As per HPI  PE:  Blood pressure (!) 87/52, pulse 60, temperature 98.1 F (36.7 C), temperature source Oral, resp. rate 16, height  (1.753 m), weight 195 lb 6 oz (88.6 kg), SpO2 98 %. Body mass index is 28.85 kg/m.  Gen: Alert, well appearing.  Patient is oriented to person, place, time, and situation. AFFECT: pleasant, lucid thought and speech. CV: RRR, no m/r/g.   LUNGS: CTA bilat, nonlabored resps, good aeration in all lung fields. EXT: no clubbing, cyanosis, or edema.    LABS:    Chemistry      Component Value Date/Time   NA 139 10/27/2016 1542   K 4.3 10/27/2016 1542   CL 105 10/27/2016 1542   CO2 25 10/27/2016 1542   BUN 11 10/27/2016 1542   CREATININE 0.68 10/27/2016 1542      Component Value Date/Time   CALCIUM 8.8 10/27/2016 1542   ALKPHOS 85 04/08/2016 1105   AST 13 10/27/2016 1542   ALT 9 10/27/2016 1542   BILITOT 0.3 10/27/2016 1542     Lab Results   Component Value Date   WBC 4.3 10/27/2016   HGB 11.7 (L) 10/27/2016   HCT 35.6 (L) 10/27/2016   MCV 85.9 10/27/2016   PLT 244.0 10/27/2016   Lab Results  Component Value Date   IRON 61 10/27/2016   TIBC 278 10/27/2016   FERRITIN 41 10/27/2016     IMPRESSION AND PLAN:  1) GAD with insomnia: improved somewhat since last visit. Continue improved diet and activity level. Continue ativan qd-bid. Controlled substance contract reviewed with patient today.  Patient signed this and it will be placed in the chart.   UDS next f/u visit. Lorazepam 2 mg, , #60, RF x 5 will be faxed to her pharmacy today.  2) Migraine HA's: resolved with discontinuation of caffeine and high fructose soft drinks!  3) Vit D def: recheck vit D level today.  Continue 50K U vit D twice weekly for now.  An After Visit Summary was printed and given to the patient.  FOLLOW UP: Return in about 6 months (around 01/02/2018) for annual CPE (fasting).  Signed:  Santiago Bumpers, MD           07/02/2017

## 2017-07-03 ENCOUNTER — Encounter: Payer: Self-pay | Admitting: *Deleted

## 2017-07-08 ENCOUNTER — Encounter: Payer: Self-pay | Admitting: Family Medicine

## 2017-07-08 ENCOUNTER — Other Ambulatory Visit: Payer: Self-pay | Admitting: Family Medicine

## 2017-07-08 DIAGNOSIS — G4700 Insomnia, unspecified: Secondary | ICD-10-CM

## 2017-07-09 ENCOUNTER — Other Ambulatory Visit: Payer: Self-pay | Admitting: Family Medicine

## 2017-07-09 MED ORDER — ZOLPIDEM TARTRATE 10 MG PO TABS: 10.0000 mg | ORAL_TABLET | Freq: Every evening | ORAL | 1 refills | Status: DC | PRN

## 2017-07-09 NOTE — Telephone Encounter (Signed)
Pt sent MyChart message requesting higher dose. Please advise. Thanks.   RF request for zolpidem LOV: 07/02/17 Next ov: None Last written: 12/01/16 #30 w/ 5RF

## 2017-07-09 NOTE — Telephone Encounter (Signed)
OK, I'll deny this  refill and I'll print rx for  dose.

## 2017-07-09 NOTE — Telephone Encounter (Signed)
Rx faxed

## 2018-04-05 ENCOUNTER — Other Ambulatory Visit: Payer: Self-pay | Admitting: Family Medicine

## 2018-04-05 MED ORDER — ZOLPIDEM TARTRATE 10 MG PO TABS: 10.0000 mg | ORAL_TABLET | Freq: Every evening | ORAL | 0 refills | Status: DC | PRN

## 2018-04-05 NOTE — Telephone Encounter (Signed)
I'll do 30d supply of zolpidem. Need to see pt for f/u insomnia before I can authorize any further rx's for this med.-thx

## 2018-04-05 NOTE — Telephone Encounter (Signed)
RF request for Zolpidem LOV: 07/02/17 Next ov: none Last written: 07/09/17  Please advise, thanks.

## 2018-04-05 NOTE — Telephone Encounter (Signed)
Forwarding message to Dr. Dietrich Pates clinical team

## 2018-04-05 NOTE — Telephone Encounter (Signed)
Copied from CRM [id number]. Topic: Quick Communication - Rx Refill/Question >> Apr 05, 2018 11:55 AM Jolayne Haines L wrote: Medication: zolpidem (AMBIEN) 10 MG tablet (Expired)  Has the patient contacted their pharmacy? Ye on Monday last week (Agent: If no, request that the patient contact the pharmacy for the refill.) (Agent: If yes, when and what did the pharmacy advise?)  Preferred Pharmacy (with phone number or street name): [id number] [place] [place] - [address] [place] [address] Phone: [phone] Fax: [phone]    Agent: Please be advised that RX refills may take up to 3 business days. We ask that you follow-up with your pharmacy.

## 2018-04-09 NOTE — Telephone Encounter (Signed)
Pt advised and voiced understanding. She will call back to schedule f/u apt.

## 2018-04-19 ENCOUNTER — Encounter: Payer: Self-pay | Admitting: Family Medicine

## 2018-04-19 ENCOUNTER — Ambulatory Visit (INDEPENDENT_AMBULATORY_CARE_PROVIDER_SITE_OTHER): Payer: 59 | Admitting: Family Medicine

## 2018-04-19 VITALS — BP 110/71 | HR 79 | Temp 98.0°F | Resp 16 | Ht 69.0 in | Wt 183.4 lb

## 2018-04-19 DIAGNOSIS — J069 Acute upper respiratory infection, unspecified: Secondary | ICD-10-CM | POA: Diagnosis not present

## 2018-04-19 DIAGNOSIS — B9789 Other viral agents as the cause of diseases classified elsewhere: Secondary | ICD-10-CM | POA: Diagnosis not present

## 2018-04-19 DIAGNOSIS — G4700 Insomnia, unspecified: Secondary | ICD-10-CM

## 2018-04-19 MED ORDER — BENZONATATE 200 MG PO CAPS: 200.0000 mg | ORAL_CAPSULE | Freq: Three times a day (TID) | ORAL | 1 refills | Status: DC | PRN

## 2018-04-19 NOTE — Progress Notes (Signed)
OFFICE VISIT  04/19/2018   CC:  Chief Complaint  Patient presents with  . Cough    x4 weeks, non-productive     HPI:    Patient is a 53 y.o.  female who presents for cough. Nasal congestion, PND, mild ST, cough-->about 1 mo ago. The URI resolved for the most part, but still with some PND that elicits cough.  NO chest tightness or SOB or wheezing.  Worse when she gets in bed at night.  No fevers recently.  Still with nasal congestion but no mucous comes out when she blows. Has not tried any mucinex.    Also has GAD and chronic insomnia.  Anxiety: not using this med lately.  Seems to be better with coping with stress lately.  She does not want to rely on anxiety med as much.  Insomnia: still occ uses ambien.  No probs.  Past Medical History:  Diagnosis Date  . Anemia   . Anxiety   . Breast abscess 03/2017   Dx'd by pt's GYN  . History of hematuria   . History of sexually transmitted disease    trich, gonorrhea, lice  . Insomnia   . Migraine syndrome   . Seasonal allergies   . Urinary tract bacterial infections   . Vaginal dryness   . Vitamin D deficiency    Vit D into normal range only with twice weekly 50K U vit D tab.    Past Surgical History:  Procedure Laterality Date  . CESAREAN SECTION    . EYE SURGERY     Lasix  . TUBAL LIGATION    . TYMPANOPLASTY  2010    Outpatient Medications Prior to Visit  Medication Sig Dispense Refill  . Multiple Vitamin (MULTIVITAMIN) tablet Take 1 tablet by mouth daily.      . Vitamin D, Ergocalciferol, (DRISDOL) 50000 units CAPS capsule TAKE 1 CAPSULE ON MONDAYS AND FRIDAYS OF EACH WEEK 24 capsule 1  . zolpidem (AMBIEN) 10 MG tablet Take 1 tablet (10 mg total) by mouth at bedtime as needed for up to 30 days for sleep. 30 tablet 0  . cyclobenzaprine (FLEXERIL) 10 MG tablet Take 1 tablet (10 mg total) by mouth 3 (three) times daily as needed for muscle spasms. (Patient not taking: Reported on 04/19/2018) 30 tablet 0  . JUNEL 1/20  1-20 MG-MCG tablet Take 1 tablet by mouth daily.  0  . LORazepam (ATIVAN) 2 MG tablet Take 1 tablet (2 mg total) by mouth every 8 (eight) hours as needed for anxiety. (Patient not taking: Reported on 04/19/2018) 60 tablet 5   No facility-administered medications prior to visit.     Allergies  Allergen Reactions  . Sulfonamide Derivatives     REACTION: Hives  . Penicillins Rash    ROS As per HPI  PE: Blood pressure 110/71, pulse 79, temperature 98 F (36.7 C), temperature source Oral, resp. rate 16, height 5\' 9"  (1.753 m), weight 183 lb 6.4 oz (83.2 kg), SpO2 99 %. VS: noted--normal. Gen: alert, NAD, NONTOXIC APPEARING. HEENT: eyes without injection, drainage, or swelling.  Ears: EACs clear, TMs with normal light reflex and landmarks.  Nose: No active rhinorrhea crusty exudate adherent to mildly injected mucosa.  No purulent d/c.  No paranasal sinus TTP.  No facial swelling.  Throat and mouth without focal lesion.  No pharyngial swelling, erythema, or exudate.   Neck: supple, no LAD.   LUNGS: CTA bilat, nonlabored resps.   CV: RRR, no m/r/g. EXT: no c/c/e SKIN:  no rash    LABS:    Chemistry      Component Value Date/Time   NA 139 10/27/2016 1542   K 4.3 10/27/2016 1542   CL 105 10/27/2016 1542   CO2 25 10/27/2016 1542   BUN 11 10/27/2016 1542   CREATININE 0.68 10/27/2016 1542      Component Value Date/Time   CALCIUM 8.8 10/27/2016 1542   ALKPHOS 85 04/08/2016 1105   AST 13 10/27/2016 1542   ALT 9 10/27/2016 1542   BILITOT 0.3 10/27/2016 1542      IMPRESSION AND PLAN:  1) URI, with lingering cough. Minimal nasal congestion on/off, possibly mild allergic rhinitis. No sign of LRTI. Tessalon 200mg  1 tid prn, #30, RF x1. Instructions: Use otc generic saline nasal spray 2-3 times per day to irrigate/moisturize your nasal passages. Try over the counter generic zyrtec daily as needed for nasal allergies.  2) Insomnia: doing well on prn ambien. She is off her benzo  for her anxiety lately.  An After Visit Summary was printed and given to the patient.  FOLLOW UP: Return if symptoms worsen or fail to improve.  Signed:  Santiago Bumpers, MD           04/19/2018

## 2018-04-19 NOTE — Patient Instructions (Signed)
  Use otc generic saline nasal spray 2-3 times per day to irrigate/moisturize your nasal passages.  Try over the counter generic zyrtec daily as needed for nasal allergies.

## 2018-06-22 ENCOUNTER — Encounter: Payer: Self-pay | Admitting: Family Medicine

## 2018-07-15 ENCOUNTER — Other Ambulatory Visit: Payer: Self-pay | Admitting: Family Medicine

## 2018-07-16 NOTE — Telephone Encounter (Signed)
RF request for Zolpidem LOV: 04/19/18 Next ov: none Last written: 04/05/18 (30,0)  No CSC or UDS on file for this medication. PMP aware printed. Please advise, thanks. Medication pending

## 2018-07-16 NOTE — Telephone Encounter (Signed)
I'll do rx for zolpidem.  Pls make pt aware of need for f/u visit every 6 mo while on this med. Next f/u should be in 3 mo.-thx

## 2018-07-16 NOTE — Telephone Encounter (Signed)
LM advising pt that refill was sent to [facility] in [place] and next f/u would be in 3 months. Advised to call and schedule f/u.

## 2018-08-17 ENCOUNTER — Other Ambulatory Visit: Payer: Self-pay

## 2018-08-17 MED ORDER — VITAMIN D (ERGOCALCIFEROL) 1.25 MG (50000 UNIT) PO CAPS: ORAL_CAPSULE | ORAL | 1 refills | Status: DC

## 2018-09-08 ENCOUNTER — Other Ambulatory Visit: Payer: Self-pay | Admitting: Obstetrics and Gynecology

## 2018-09-08 DIAGNOSIS — N6489 Other specified disorders of breast: Secondary | ICD-10-CM

## 2018-09-10 ENCOUNTER — Other Ambulatory Visit: Payer: Self-pay

## 2018-09-10 ENCOUNTER — Ambulatory Visit: Payer: 59

## 2018-09-10 ENCOUNTER — Ambulatory Visit
Admission: RE | Admit: 2018-09-10 | Discharge: 2018-09-10 | Disposition: A | Discharge status: Home / Self Care | Payer: No Typology Code available for payment source | Source: Ambulatory Visit | Attending: Obstetrics and Gynecology | Admitting: Obstetrics and Gynecology

## 2018-09-10 DIAGNOSIS — N6489 Other specified disorders of breast: Secondary | ICD-10-CM

## 2018-09-10 LAB — HM MAMMOGRAPHY

## 2018-12-02 ENCOUNTER — Other Ambulatory Visit: Payer: Self-pay

## 2018-12-02 ENCOUNTER — Encounter: Payer: Self-pay | Admitting: Family Medicine

## 2018-12-02 ENCOUNTER — Ambulatory Visit (INDEPENDENT_AMBULATORY_CARE_PROVIDER_SITE_OTHER): Payer: No Typology Code available for payment source | Admitting: Family Medicine

## 2018-12-02 VITALS — BP 129/88 | HR 64

## 2018-12-02 DIAGNOSIS — F411 Generalized anxiety disorder: Secondary | ICD-10-CM

## 2018-12-02 DIAGNOSIS — F99 Mental disorder, not otherwise specified: Secondary | ICD-10-CM

## 2018-12-02 DIAGNOSIS — F5105 Insomnia due to other mental disorder: Secondary | ICD-10-CM | POA: Diagnosis not present

## 2018-12-02 MED ORDER — ZOLPIDEM TARTRATE 10 MG PO TABS
ORAL_TABLET | ORAL | 3 refills | Status: DC
Start: 2018-12-02 — End: 2019-08-17

## 2018-12-02 MED ORDER — LORAZEPAM 2 MG PO TABS: ORAL_TABLET | ORAL | 3 refills | Status: DC

## 2018-12-02 NOTE — Progress Notes (Signed)
Virtual Visit via Video Note  I connected with pt on 12/02/18 at  2:30 PM EDT by a video enabled telemedicine application and verified that I am speaking with the correct person using two identifiers.  Location patient: home Location provider:work or home office Persons participating in the virtual visit: patient, provider  I discussed the limitations of evaluation and management by telemedicine and the availability of in person appointments. The patient expressed understanding and agreed to proceed.  Telemedicine visit is a necessity given the COVID-19 restrictions in place at the current time.  HPI: 53 y/o WF being seen today for f/u anxiety and anxiety-related insomnia. Has been on ativan qd - bid in the past and this has been very helpful. Has been on nightly ambien as well.  Interim hx: Doing well.  Taking care of her grandchild today. Rare use of lorazepam 2mg  prn anxiety, about 1-2 times per month. Zolpidem helpful and uses this most nights.  Anxiety levels are her main issue regarding impaired sleep initiation/maintenance.  Working some on sleep hygiene.   ROS: See pertinent positives and negatives per HPI.  Past Medical History:  Diagnosis Date  . Anemia   . Anxiety   . History of hematuria   . History of sexually transmitted disease    trich, gonorrhea, lice  . Insomnia   . Menopausal symptom    flushing and vag dryness->HRT 2020 per gyn  . Migraine syndrome   . Seasonal allergies   . Urinary tract bacterial infections   . Vitamin D deficiency    Vit D into normal range only with twice weekly 50K U vit D tab.    Past Surgical History:  Procedure Laterality Date  . CESAREAN SECTION  1990  . EYE SURGERY     Lasik  . TUBAL LIGATION    . TYMPANOPLASTY  2010    Family History  Problem Relation Age of Onset  . Hypertension Mother   . Dementia Mother   . Heart disease Father   . Cancer Father   . Breast cancer Paternal Aunt        81's     SOCIAL HX:   Social History   Socioeconomic History  . Marital status: Married    Spouse name: Not on file  . Number of children: 2  . Years of education: Not on file  . Highest education level: Not on file  Occupational History    Employer: [employer]  . Financial resource strain: Not on file  . Food insecurity    Worry: Not on file    Inability: Not on file  . Transportation needs    Medical: Not on file    Non-medical: Not on file  Tobacco Use  . Smoking status: Never Smoker  . Smokeless tobacco: Never Used  Substance and Sexual Activity  . Alcohol use: No  . Drug use: No  . Sexual activity: Yes    Birth control/protection: Surgical  Lifestyle  . Physical activity    Days per week: Not on file    Minutes per session: Not on file  . Stress: Not on file  Relationships  . Social Herbalist on phone: Not on file    Gets together: Not on file    Attends religious service: Not on file    Active member of club or organization: Not on file    Attends meetings of clubs or organizations: Not on file    Relationship status:  Not on file  Other Topics Concern  . Not on file  Social History Narrative   Ms Jennette lives with her husband, works for [employer], and has two grown children. Her son serves in the [employer] and is currently serving in [place].  She will be going to [place] 04/2014 to visit him.  Her daughter studied psychology at [employer] and has returned for graduate studies.    No T/A/Ds.      Current Outpatient Medications:  .  estradiol (CLIMARA - DOSED IN MG/24 HR) 0.05 mg/24hr patch, Place 0.1 mg onto the skin once a week. , Disp: , Rfl:  .  Multiple Vitamin (MULTIVITAMIN) tablet, Take 1 tablet by mouth daily.  , Disp: , Rfl:  .  Vitamin D, Ergocalciferol, (DRISDOL) 1.25 MG (50000 UT) CAPS capsule, TAKE 1 CAPSULE ON MONDAYS AND FRIDAYS OF EACH WEEK, Disp: 24 capsule, Rfl: 1 .  zolpidem (AMBIEN) 10 MG tablet, 1 tab po qhs prn insomnia,  Disp: 30 tablet, Rfl: 1  EXAM:  VITALS per patient if applicable: BP 129/88 (BP Location: Left Arm, Patient Position: Sitting, Cuff Size: Normal)   Pulse 64    GENERAL: alert, oriented, appears well and in no acute distress  HEENT: atraumatic, conjunttiva clear, no obvious abnormalities on inspection of external nose and ears  NECK: normal movements of the head and neck  LUNGS: on inspection no signs of respiratory distress, breathing rate appears normal, no obvious gross SOB, gasping or wheezing  CV: no obvious cyanosis  MS: moves all visible extremities without noticeable abnormality  PSYCH/NEURO: pleasant and cooperative, no obvious depression or anxiety, speech and thought processing grossly intact  LABS: none today    Chemistry      Component Value Date/Time   NA 139 10/27/2016 1542   K 4.3 10/27/2016 1542   CL 105 10/27/2016 1542   CO2 25 10/27/2016 1542   BUN 11 10/27/2016 1542   CREATININE 0.68 10/27/2016 1542      Component Value Date/Time   CALCIUM 8.8 10/27/2016 1542   ALKPHOS 85 04/08/2016 1105   AST 13 10/27/2016 1542   ALT 9 10/27/2016 1542   BILITOT 0.3 10/27/2016 1542     Lab Results  Component Value Date   CHOL 183 04/08/2016   HDL 59.20 04/08/2016   LDLCALC 112 (H) 04/08/2016   TRIG 59.0 04/08/2016   CHOLHDL 3 04/08/2016   Lab Results  Component Value Date   TSH 1.18 04/08/2016   Lab Results  Component Value Date   HGBA1C 5.4 08/15/2014   Lab Results  Component Value Date   WBC 4.3 10/27/2016   HGB 11.7 (L) 10/27/2016   HCT 35.6 (L) 10/27/2016   MCV 85.9 10/27/2016   PLT 244.0 10/27/2016    ASSESSMENT AND PLAN:  Discussed the following assessment and plan:  GAD: stable.  Continue lorazepam 2mg , 1/2-1 tab bid prn.  Insomnia, anxiety related: continue ambien 10mg  qhs prn.  She is to come by at her earliest convenience and sign updated CSC.  I discussed the assessment and treatment plan with the patient. The patient was  provided an opportunity to ask questions and all were answered. The patient agreed with the plan and demonstrated an understanding of the instructions.   The patient was advised to call back or seek an in-person evaluation if the symptoms worsen or if the condition fails to improve as anticipated.  F/u: 6 mo cpe  Signed:  , MD  12/02/2018  

## 2019-02-09 ENCOUNTER — Telehealth: Payer: Self-pay

## 2019-02-09 MED ORDER — BENZONATATE 200 MG PO CAPS: 200.0000 mg | ORAL_CAPSULE | Freq: Three times a day (TID) | ORAL | 0 refills | Status: DC | PRN

## 2019-02-09 NOTE — Telephone Encounter (Signed)
Tessalon eRx'd. No appt needed.

## 2019-02-09 NOTE — Telephone Encounter (Signed)
Patient is requesting tessalon pearls Rx to [facility]. Patient has a dry hacking cough which she has had a while. She has had several recent negative COVID due to frequent testing so that she can take care of her grandson.

## 2019-02-09 NOTE — Telephone Encounter (Signed)
Please advise if this is appropriate, or does she need OV?  ( virtual or in office? )

## 2019-02-10 NOTE — Telephone Encounter (Signed)
Tried to contact patient to advise RX was sent, but no answer and MB is full.

## 2019-02-11 HISTORY — PX: COLONOSCOPY: SHX174

## 2019-02-11 LAB — HM COLONOSCOPY

## 2019-02-14 IMAGING — MG DIGITAL DIAGNOSTIC BILATERAL MAMMOGRAM WITH TOMO AND CAD
6 of 10 series · 6 of 30 positions shown · non-contrast
Comparison: None

CLINICAL DATA: Patient presents with a lesion in the left breast.
This lesion began as a lump that subsequently drained greenish
material. It has since significantly decreased in size, now just a
small area of persistent redness.

EXAM:
DIGITAL DIAGNOSTIC BILATERAL MAMMOGRAM WITH CAD AND TOMO
ULTRASOUND LEFT BREAST

[R MLO synth-2D]
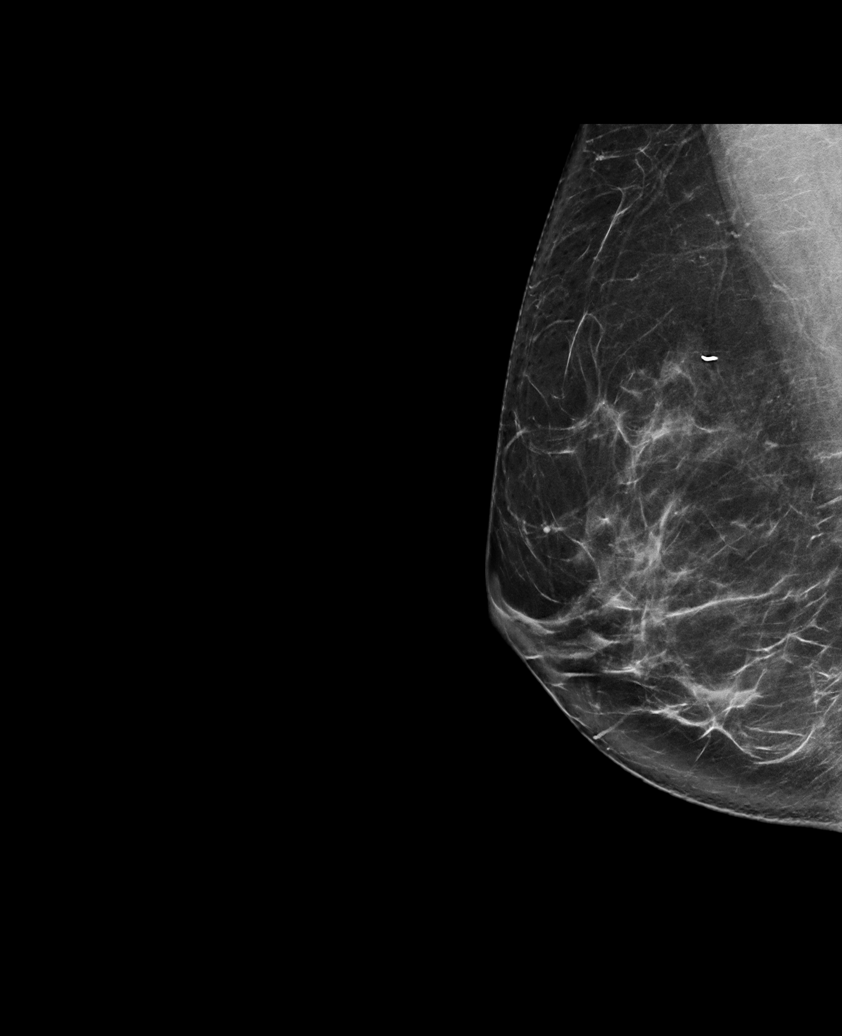

[R CC synth-2D]
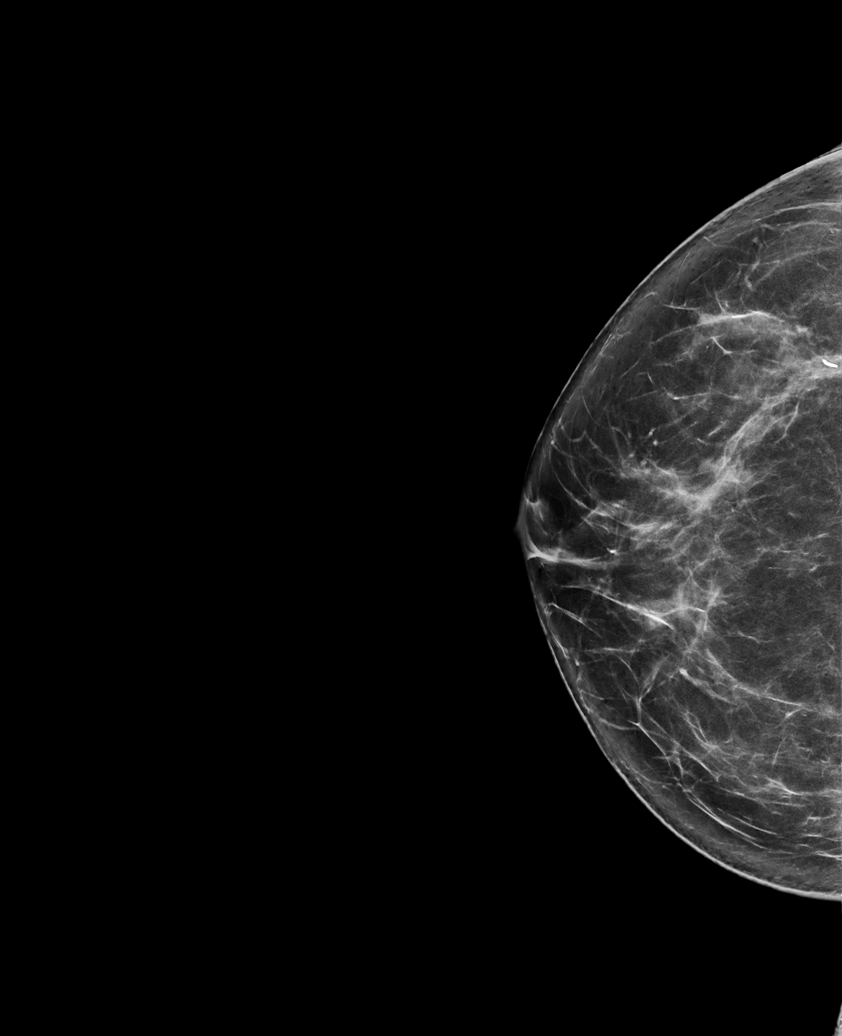

[L MLO synth-2D]
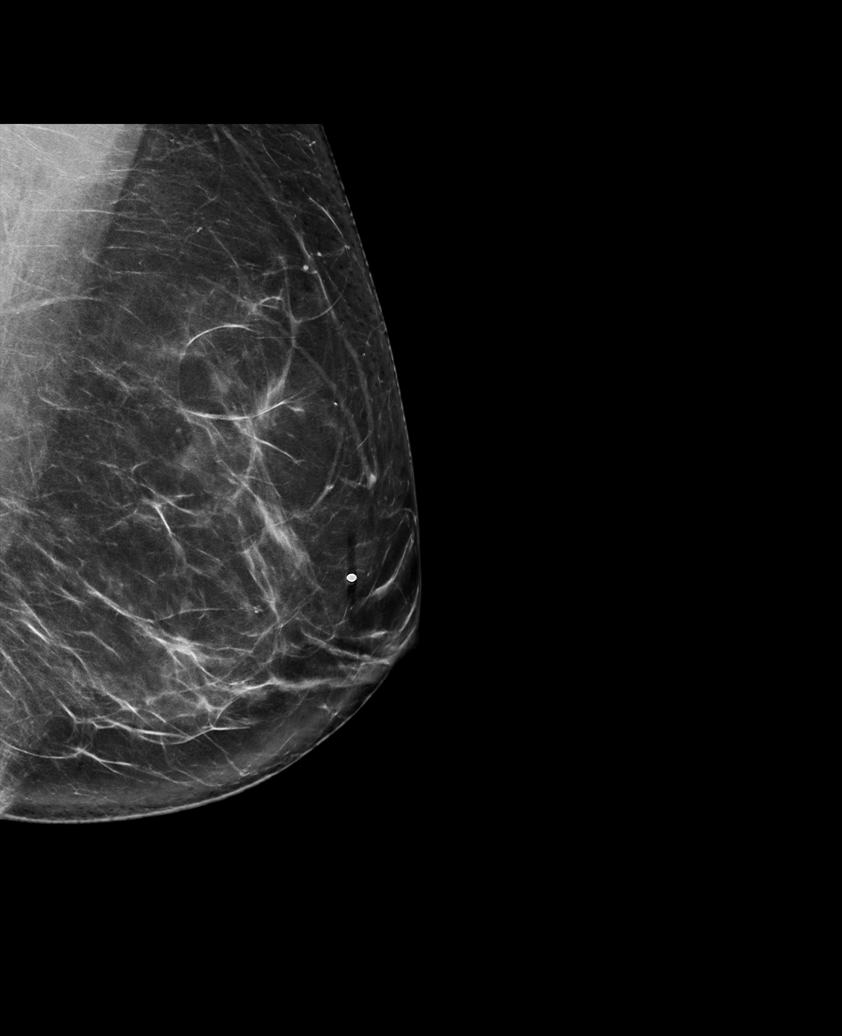

[L CC synth-2D]
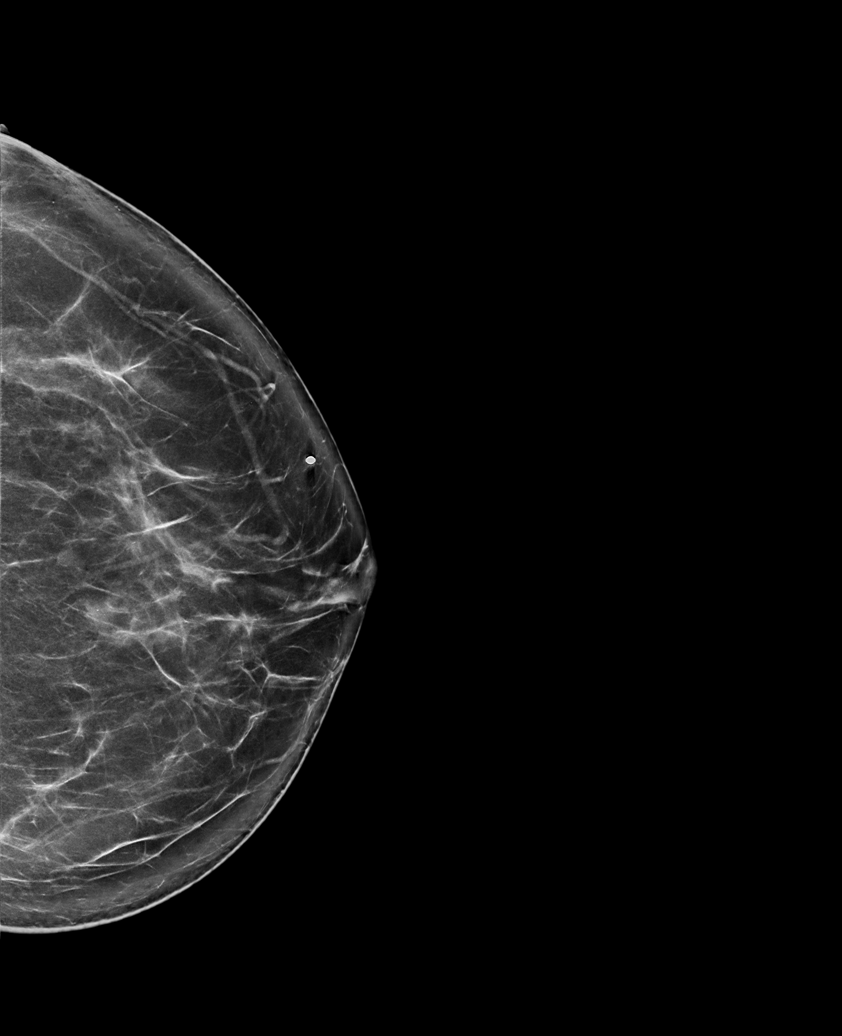

[L TAN synth-2D]
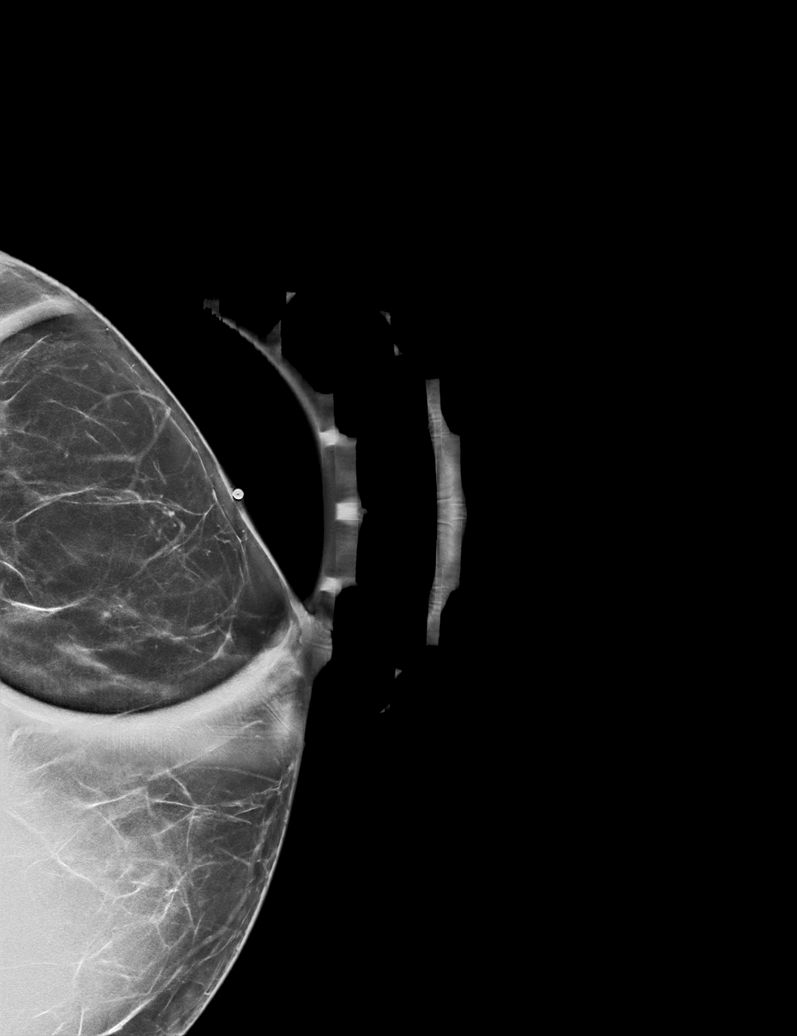

[L MLO tomo · tomo slice 43/85.0]
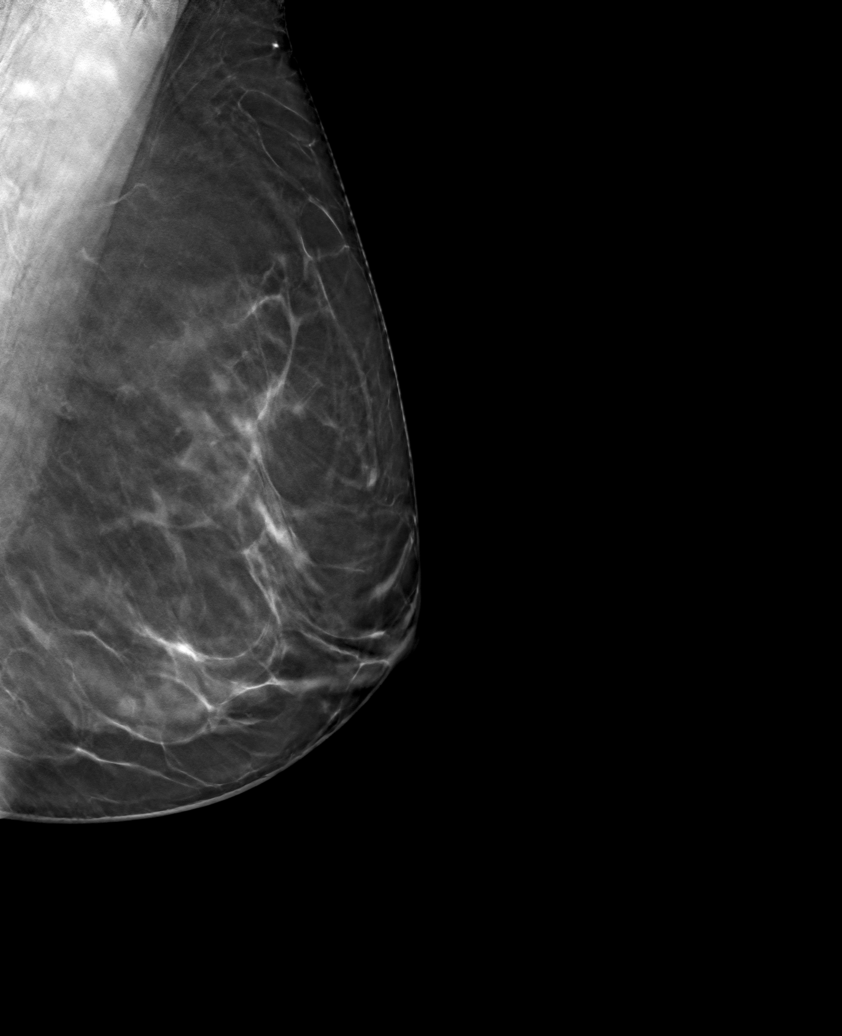

[6 of 30 positions shown; findings below may reference images not displayed]

ACR Breast Density Category b: There are scattered areas of
fibroglandular density.
FINDINGS: There are no discrete masses, areas of architectural distortion,
areas of significant asymmetry or suspicious calcifications.

Mammographic images were processed with CAD.

On physical exam, there is a small, raised area of erythema with a
superficial small central ulceration of the skin lateral to the left
nipple. No underlying mass.

Targeted ultrasound is performed, showing a small ill-defined
hypoechoic area within the skin, mildly thickening the skin,
measuring 5 mm in greatest dimension.
IMPRESSION: 1. Benign skin lesion, likely an infected skin gland, that has
mostly resolved.
2. No evidence of breast malignancy.

RECOMMENDATION:
Screening mammogram in one year.(Code:[id number])

I have discussed the findings and recommendations with the patient.
Results were also provided in writing at the conclusion of the
visit. If applicable, a reminder letter will be sent to the patient
regarding the next appointment.

BI-RADS CATEGORY  2: Benign.

## 2019-07-21 ENCOUNTER — Emergency Department (HOSPITAL_COMMUNITY)
Admission: EM | Admit: 2019-07-21 | Discharge: 2019-07-21 | Disposition: A | Discharge status: Home / Self Care | Payer: No Typology Code available for payment source | Attending: Emergency Medicine | Admitting: Emergency Medicine

## 2019-07-21 ENCOUNTER — Encounter (HOSPITAL_COMMUNITY): Payer: Self-pay | Admitting: Emergency Medicine

## 2019-07-21 ENCOUNTER — Emergency Department (HOSPITAL_COMMUNITY): Payer: No Typology Code available for payment source

## 2019-07-21 DIAGNOSIS — N950 Postmenopausal bleeding: Secondary | ICD-10-CM | POA: Insufficient documentation

## 2019-07-21 DIAGNOSIS — Z79899 Other long term (current) drug therapy: Secondary | ICD-10-CM | POA: Insufficient documentation

## 2019-07-21 DIAGNOSIS — N939 Abnormal uterine and vaginal bleeding, unspecified: Secondary | ICD-10-CM

## 2019-07-21 LAB — BASIC METABOLIC PANEL
Anion gap: 8 (ref 5–15)
BUN: 8 mg/dL (ref 6–20)
CO2: 25 mmol/L (ref 22–32)
Calcium: 8.7 mg/dL — ABNORMAL LOW (ref 8.9–10.3)
Chloride: 106 mmol/L (ref 98–111)
Creatinine, Ser: 0.68 mg/dL (ref 0.44–1.00)
GFR calc Af Amer: >60 mL/min (ref >60)
GFR calc non Af Amer: >60 mL/min (ref >60)
Glucose, Bld: 93 mg/dL (ref 70–99)
Potassium: 4.1 mmol/L (ref 3.5–5.1)
Sodium: 139 mmol/L (ref 135–145)

## 2019-07-21 LAB — CBC WITH DIFFERENTIAL/PLATELET
Abs Immature Granulocytes: 0.01 10*3/uL (ref 0.00–0.07)
Basophils Absolute: 0.1 10*3/uL (ref 0.0–0.1)
Basophils Relative: 1 %
Eosinophils Absolute: 0.1 10*3/uL (ref 0.0–0.5)
Eosinophils Relative: 1 %
HCT: 36 % (ref 36.0–46.0)
Hemoglobin: 11.6 g/dL — ABNORMAL LOW (ref 12.0–15.0)
Immature Granulocytes: 0 %
Lymphocytes Relative: 27 %
Lymphs Abs: 1.4 10*3/uL (ref 0.7–4.0)
MCH: 28.2 pg (ref 26.0–34.0)
MCHC: 32.2 g/dL (ref 30.0–36.0)
MCV: 87.6 fL (ref 80.0–100.0)
Monocytes Absolute: 0.3 10*3/uL (ref 0.1–1.0)
Monocytes Relative: 7 %
Neutro Abs: 3.2 10*3/uL (ref 1.7–7.7)
Neutrophils Relative %: 64 %
Platelets: 226 10*3/uL (ref 150–400)
RBC: 4.11 MIL/uL (ref 3.87–5.11)
RDW: 12.9 % (ref 11.5–15.5)
WBC: 5 10*3/uL (ref 4.0–10.5)
nRBC: 0 % (ref 0.0–0.2)

## 2019-07-21 LAB — WET PREP, GENITAL
Sperm: NONE SEEN
Trich, Wet Prep: NONE SEEN
Yeast Wet Prep HPF POC: NONE SEEN

## 2019-07-21 MED ORDER — TRANEXAMIC ACID 650 MG PO TABS: 1300.0000 mg | ORAL_TABLET | Freq: Three times a day (TID) | ORAL | 0 refills | Status: DC

## 2019-07-21 MED ORDER — TRANEXAMIC ACID 650 MG PO TABS: 1300.0000 mg | ORAL_TABLET | Freq: Three times a day (TID) | ORAL | 0 refills | Status: AC

## 2019-07-21 NOTE — ED Triage Notes (Signed)
Vaginal bleeding started 4 weeks ago , now is changing pads every 2 hours- passing clots,  Has not had a normal period in almost a year-- is using a hormone patch, taking a "pill" to stop bleeding from GYN.

## 2019-07-21 NOTE — ED Provider Notes (Signed)
[facility] EMERGENCY DEPARTMENT Provider Note   CSN: [id number] Arrival date & time: 07/21/19  1012     History No chief complaint on file.   Colleen Ruiz is a 54 y.o. female with past medical history of anemia presenting to the ED with a chief complaint of vaginal bleeding.  States that she has been postmenopausal since early 2020.  In January/February 2021, she had an episode of vaginal bleeding which improved with taking medroxyprogesterone 10 mg pills for 10 days.  She was also given an estradiol patch last August to help with menopause symptoms.  For the past 3 weeks she started having some vaginal bleeding, going through 1 pad every day.  She states for the past week she has had worsening bleeding, for the past 2 days has been having "gushing" of blood and passing clots.  She took an additional course of the medroxyprogesterone pills for 10 days and finished the last course on 07/12/2019.  She was scheduled for an ultrasound with her GYN doctor on June 21.  She reports fatigue but denies any abdominal or pelvic pain, lightheadedness, shortness of breath, anticoagulant use, vomiting, diarrhea, vaginal discharge, fevers or chest pain. She is a patient of Dr. [name] [facility]).  HPI     Past Medical History:  Diagnosis Date   Anemia    Anxiety    History of hematuria    History of sexually transmitted disease    trich, gonorrhea, lice   Insomnia    Menopausal symptom    flushing and vag dryness->HRT 2020 per gyn   Migraine syndrome    Seasonal allergies    Urinary tract bacterial infections    Vitamin D deficiency    Vit D into normal range only with twice weekly 50K U vit D tab.    Patient Active Problem List   Diagnosis Date Noted   Adjustment disorder with mixed anxiety and depressed mood 02/08/2014   Health maintenance examination 02/08/2014   Irregular menstrual cycle 02/08/2014   Headache, migraine 11/11/2013   Situational  anxiety 11/11/2013   Annual physical exam 03/03/2011   Insomnia 02/19/2010    Past Surgical History:  Procedure Laterality Date   CESAREAN SECTION  1990   EYE SURGERY     Lasik   TUBAL LIGATION     TYMPANOPLASTY  2010     OB History    Gravida  3   Para  2   Term      Preterm      AB  1   Living  2     SAB      TAB      Ectopic      Multiple      Live Births           Obstetric Comments  Menarche 54 yo., cycle q28d, lasts 3-4 d, mod flow.        Family History  Problem Relation Age of Onset   Hypertension Mother    Dementia Mother    Heart disease Father    Cancer Father    Breast cancer Paternal Aunt        30's     Social History   Tobacco Use   Smoking status: Never Smoker   Smokeless tobacco: Never Used  Substance Use Topics   Alcohol use: No   Drug use: No    Home Medications Prior to Admission medications   Medication Sig Start Date End Date Taking? Authorizing  Provider  benzonatate (TESSALON) 200 MG capsule Take 1 capsule (200 mg total) by mouth 3 (three) times daily as needed for cough. 02/09/19   McGowen, Adrian Blackwater, MD  estradiol (CLIMARA - DOSED IN MG/24 HR) 0.05 mg/24hr patch Place 0.1 mg onto the skin once a week.  08/02/18   [provider]  LORazepam (ATIVAN) 2 MG tablet 1/2 -1 tab po bid prn anxiety 12/02/18   McGowen, Adrian Blackwater, MD  Multiple Vitamin (MULTIVITAMIN) tablet Take 1 tablet by mouth daily.      [provider]  tranexamic acid (LYSTEDA) 650 MG TABS tablet Take 2 tablets (1,300 mg total) by mouth 3 (three) times daily for 5 days. 07/21/19 07/26/19  Alisse Tuite, PA-C  Vitamin D, Ergocalciferol, (DRISDOL) 1.25 MG (50000 UT) CAPS capsule TAKE 1 CAPSULE ON MONDAYS AND FRIDAYS OF EACH WEEK 08/17/18   McGowen, Adrian Blackwater, MD  zolpidem (AMBIEN) 10 MG tablet 1 tab po qhs prn insomnia 12/02/18   McGowen, Adrian Blackwater, MD    Allergies    Sulfonamide derivatives and Penicillins  Review of Systems     Review of Systems  Constitutional: Negative for appetite change, chills and fever.  HENT: Negative for ear pain, rhinorrhea, sneezing and sore throat.   Eyes: Negative for photophobia and visual disturbance.  Respiratory: Negative for cough, chest tightness, shortness of breath and wheezing.   Cardiovascular: Negative for chest pain and palpitations.  Gastrointestinal: Negative for abdominal pain, blood in stool, constipation, diarrhea, nausea and vomiting.  Genitourinary: Positive for vaginal bleeding. Negative for dysuria, hematuria and urgency.  Musculoskeletal: Negative for myalgias.  Skin: Negative for rash.  Neurological: Negative for dizziness, weakness and light-headedness.    Physical Exam Updated Vital Signs BP 125/86    Pulse 72    Temp 98.4 F (36.9 C) (Oral)    Resp 16    Ht 5\' 10"  (1.778 m)    Wt 83 kg    SpO2 100%    BMI 26.26 kg/m   Physical Exam Vitals and nursing note reviewed. Exam conducted with a chaperone present.  Constitutional:      General: She is not in acute distress.    Appearance: She is well-developed.  HENT:     Head: Normocephalic and atraumatic.     Nose: Nose normal.  Eyes:     General: No scleral icterus.       Left eye: No discharge.     Conjunctiva/sclera: Conjunctivae normal.  Cardiovascular:     Rate and Rhythm: Normal rate and regular rhythm.     Heart sounds: Normal heart sounds. No murmur heard.  No friction rub. No gallop.   Pulmonary:     Effort: Pulmonary effort is normal. No respiratory distress.     Breath sounds: Normal breath sounds.  Abdominal:     General: Bowel sounds are normal. There is no distension.     Palpations: Abdomen is soft.     Tenderness: There is no abdominal tenderness. There is no guarding.  Genitourinary:    Vagina: Bleeding present.     Cervix: No cervical motion tenderness.     Uterus: Not tender.      Adnexa:        Right: No tenderness.         Left: No tenderness.       Comments: Pelvic exam:  normal external genitalia without evidence of trauma. VULVA: normal appearing vulva with no masses, tenderness or lesion. VAGINA: normal appearing vagina with normal color  and discharge, no lesions.  Moderate amount of blood noted in vaginal vault. CERVIX: normal appearing cervix without lesions, cervical motion tenderness absent, cervical os closed with out purulent discharge; No vaginal discharge. Wet prep and DNA probe for chlamydia and GC obtained.   ADNEXA: normal adnexa in size, nontender and no masses UTERUS: uterus is normal size, shape, consistency and nontender.   Musculoskeletal:        General: Normal range of motion.     Cervical back: Normal range of motion and neck supple.  Skin:    General: Skin is warm and dry.     Findings: No rash.  Neurological:     Mental Status: She is alert.     Motor: No abnormal muscle tone.     Coordination: Coordination normal.     ED Results / Procedures / Treatments   Labs (all labs ordered are listed, but only abnormal results are displayed) Labs Reviewed  WET PREP, GENITAL - Abnormal; Notable for the following components:      Result Value   Clue Cells Wet Prep HPF POC PRESENT (*)    WBC, Wet Prep HPF POC FEW (*)    All other components within normal limits  CBC WITH DIFFERENTIAL/PLATELET - Abnormal; Notable for the following components:   Hemoglobin 11.6 (*)    All other components within normal limits  BASIC METABOLIC PANEL - Abnormal; Notable for the following components:   Calcium 8.7 (*)    All other components within normal limits  SAMPLE TO BLOOD BANK  GC/CHLAMYDIA PROBE AMP ([place]) NOT AT [facility]    EKG None  Radiology US PELVIC COMPLETE WITH TRANSVAGINAL  Result Date: 07/21/2019 CLINICAL DATA:  Postmenopausal bleeding.  LMP 1 year ago EXAM: TRANSABDOMINAL AND TRANSVAGINAL ULTRASOUND OF PELVIS TECHNIQUE: Both transabdominal and transvaginal ultrasound examinations of the pelvis were performed. Transabdominal  technique was performed for global imaging of the pelvis including uterus, ovaries, adnexal regions, and pelvic cul-de-sac. It was necessary to proceed with endovaginal exam following the transabdominal exam to visualize the endometrium. COMPARISON:  None FINDINGS: Uterus Measurements: 9.5 x 6.3 x 6.2 cm = volume: 191 mL. Retroflexed. Subserosal fundal fibroid measuring 4.9 x 4.4 x 4.6 cm. Endometrium Thickness: 11 mm.  No focal abnormality visualized. Right ovary Measurements: 2.6 x 1.5 x 1.6 cm = volume: 3 mL. Normal appearance/no adnexal mass. Left ovary Measurements: 2.7 x 1.6 x 2.2 cm = volume: 5 mL. Normal appearance/no adnexal mass. Other findings Trace free fluid within the cul-de-sac. IMPRESSION: 1. Endometrial thickness of 11 mm. In the setting of post-menopausal bleeding, endometrial sampling is indicated to exclude carcinoma. If results are benign, sonohysterogram should be considered for focal lesion work-up. (Ref: Radiological Reasoning: Algorithmic Workup of Abnormal Vaginal Bleeding with Endovaginal Sonography and Sonohysterography. AJR 2008; [id number]:M35-36) 2. Uterine fundal fibroid measuring to 4.9 cm. 3. Unremarkable sonographic appearance of the ovaries. Electronically Signed   By: Duanne Guess D.O.   On: 07/21/2019 14:20    Procedures Procedures (including critical care time)  Medications Ordered in ED Medications - No data to display  ED Course  I have reviewed the triage vital signs and the nursing notes.  Pertinent labs & imaging results that were available during my care of the patient were reviewed by me and considered in my medical decision making (see chart for details).  Clinical Course as of Jul 20 1500  Thu Jul 21, 2019  1437 Spoke to Dr. Mindi Slicker, OB-GYN on call for [facility].  States that patient can follow-up in the clinic as soon as possible, possibly tomorrow.  Asked that we prescribe her TXA tablets, she can start taking these tomorrow if she is  not able to follow-up in the clinic.   [HK]    Clinical Course User Index [HK] Dietrich Pates, PA-C   MDM Rules/Calculators/A&P                          54 year old female with a past medical history of anemia presenting to the ED with a chief complaint of vaginal bleeding.  She has been postmenopausal since early 2020 but starting January/February of this year she has had vaginal bleeding.  First episode improved with Provera for 10 days.  She has been given an estradiol patch last August to help with menopause symptoms.  This current episode of bleeding started 3 weeks ago but worsened 2 days ago.  She again completed another 10-day course of Provera.  Pelvic exam showed moderate amount of blood in the vaginal vault without any cervical motion tenderness, adnexal tenderness or uterine tenderness.  Lab work significant for normal hemoglobin and hematocrit, she is hemodynamically stable.  Pelvic ultrasound shows 11 mm thickness of endometrium.  Spoke to on-call OB/GYN for [facility] who recommends TXA tablets and following up in clinic.  She will start taking these tomorrow if she cannot see her GYN in the clinic.  Patient is agreeable to this plan, denies any vaginal discharge, no plan to treat for BV as wet prep is positive for clue cells.  Patient will call OB/GYN office today and return for worsening symptoms.  All imaging, if done today, including plain films, CT scans, and ultrasounds, independently reviewed by me, and interpretations confirmed via formal radiology reads.  Patient is hemodynamically stable, in NAD, and able to ambulate in the ED. Evaluation does not show pathology that would require ongoing emergent intervention or inpatient treatment. I explained the diagnosis to the patient. Pain has been managed and has no complaints prior to discharge. Patient is comfortable with above plan and is stable for discharge at this time. All questions were answered prior to  disposition. Strict return precautions for returning to the ED were discussed. Encouraged follow up with PCP.   An After Visit Summary was printed and given to the patient.   Portions of this note were generated with Scientist, clinical (histocompatibility and immunogenetics). Dictation errors may occur despite best attempts at proofreading.  Final Clinical Impression(s) / ED Diagnoses Final diagnoses:  Abnormal vaginal bleeding    Rx / DC Orders ED Discharge Orders         Ordered    tranexamic acid (LYSTEDA) 650 MG TABS tablet  3 times daily     Discontinue  Reprint     07/21/19 1447           Dietrich Pates, PA-C 07/21/19 1502    Bethann Berkshire, MD 07/25/19 1520

## 2019-07-21 NOTE — ED Notes (Signed)
Patient transported to US 

## 2019-07-21 NOTE — Discharge Instructions (Signed)
Follow up with your OB/GYN provider as soon as possible. If you cannot see them tomorrow in the clinic, you will need to start taking the TXA tablets as prescribed. Return to the ER for worsening bleeding, lightheadedness, chest pain, shortness of breath.

## 2019-07-22 LAB — SAMPLE TO BLOOD BANK

## 2019-07-22 LAB — GC/CHLAMYDIA PROBE AMP (~~LOC~~) NOT AT ARMC
Chlamydia: NEGATIVE
Comment: NEGATIVE
Comment: NORMAL
Neisseria Gonorrhea: NEGATIVE

## 2019-08-17 ENCOUNTER — Ambulatory Visit (INDEPENDENT_AMBULATORY_CARE_PROVIDER_SITE_OTHER): Payer: No Typology Code available for payment source | Admitting: Family Medicine

## 2019-08-17 ENCOUNTER — Other Ambulatory Visit: Payer: Self-pay

## 2019-08-17 ENCOUNTER — Encounter: Payer: Self-pay | Admitting: Family Medicine

## 2019-08-17 VITALS — BP 119/79 | HR 66 | Temp 98.1°F | Resp 16 | Ht 69.0 in | Wt 185.4 lb

## 2019-08-17 DIAGNOSIS — Z0000 Encounter for general adult medical examination without abnormal findings: Secondary | ICD-10-CM

## 2019-08-17 DIAGNOSIS — E559 Vitamin D deficiency, unspecified: Secondary | ICD-10-CM

## 2019-08-17 DIAGNOSIS — F411 Generalized anxiety disorder: Secondary | ICD-10-CM

## 2019-08-17 DIAGNOSIS — Z13220 Encounter for screening for lipoid disorders: Secondary | ICD-10-CM

## 2019-08-17 DIAGNOSIS — N938 Other specified abnormal uterine and vaginal bleeding: Secondary | ICD-10-CM | POA: Diagnosis not present

## 2019-08-17 DIAGNOSIS — Z23 Encounter for immunization: Secondary | ICD-10-CM | POA: Diagnosis not present

## 2019-08-17 DIAGNOSIS — G4700 Insomnia, unspecified: Secondary | ICD-10-CM

## 2019-08-17 DIAGNOSIS — D649 Anemia, unspecified: Secondary | ICD-10-CM | POA: Diagnosis not present

## 2019-08-17 MED ORDER — ZOLPIDEM TARTRATE 10 MG PO TABS: ORAL_TABLET | ORAL | 3 refills | Status: DC

## 2019-08-17 NOTE — Patient Instructions (Signed)
Health Maintenance, Female Adopting a healthy lifestyle and getting preventive care are important in promoting health and wellness. Ask your health care provider about:  The right schedule for you to have regular tests and exams.  Things you can do on your own to prevent diseases and keep yourself healthy. What should I know about diet, weight, and exercise? Eat a healthy diet   Eat a diet that includes plenty of vegetables, fruits, low-fat dairy products, and lean protein.  Do not eat a lot of foods that are high in solid fats, added sugars, or sodium. Maintain a healthy weight Body mass index (BMI) is used to identify weight problems. It estimates body fat based on height and weight. Your health care provider can help determine your BMI and help you achieve or maintain a healthy weight. Get regular exercise Get regular exercise. This is one of the most important things you can do for your health. Most adults should:  Exercise for at least 150 minutes each week. The exercise should increase your heart rate and make you sweat (moderate-intensity exercise).  Do strengthening exercises at least twice a week. This is in addition to the moderate-intensity exercise.  Spend less time sitting. Even light physical activity can be beneficial. Watch cholesterol and blood lipids Have your blood tested for lipids and cholesterol at 54 years of age, then have this test every 5 years. Have your cholesterol levels checked more often if:  Your lipid or cholesterol levels are high.  You are older than 54 years of age.  You are at high risk for heart disease. What should I know about cancer screening? Depending on your health history and family history, you may need to have cancer screening at various ages. This may include screening for:  Breast cancer.  Cervical cancer.  Colorectal cancer.  Skin cancer.  Lung cancer. What should I know about heart disease, diabetes, and high blood  pressure? Blood pressure and heart disease  High blood pressure causes heart disease and increases the risk of stroke. This is more likely to develop in people who have high blood pressure readings, are of African descent, or are overweight.  Have your blood pressure checked: ? Every 3-5 years if you are 18-39 years of age. ? Every year if you are 40 years old or older. Diabetes Have regular diabetes screenings. This checks your fasting blood sugar level. Have the screening done:  Once every three years after age 40 if you are at a normal weight and have a low risk for diabetes.  More often and at a younger age if you are overweight or have a high risk for diabetes. What should I know about preventing infection? Hepatitis B If you have a higher risk for hepatitis B, you should be screened for this virus. Talk with your health care provider to find out if you are at risk for hepatitis B infection. Hepatitis C Testing is recommended for:  Everyone born from 1945 through 1965.  Anyone with known risk factors for hepatitis C. Sexually transmitted infections (STIs)  Get screened for STIs, including gonorrhea and chlamydia, if: ? You are sexually active and are younger than 54 years of age. ? You are older than 54 years of age and your health care provider tells you that you are at risk for this type of infection. ? Your sexual activity has changed since you were last screened, and you are at increased risk for chlamydia or gonorrhea. Ask your health care provider if   you are at risk.  Ask your health care provider about whether you are at high risk for HIV. Your health care provider may recommend a prescription medicine to help prevent HIV infection. If you choose to take medicine to prevent HIV, you should first get tested for HIV. You should then be tested every 3 months for as long as you are taking the medicine. Pregnancy  If you are about to stop having your period (premenopausal) and  you may become pregnant, seek counseling before you get pregnant.  Take 400 to 800 micrograms (mcg) of folic acid every day if you become pregnant.  Ask for birth control (contraception) if you want to prevent pregnancy. Osteoporosis and menopause Osteoporosis is a disease in which the bones lose minerals and strength with aging. This can result in bone fractures. If you are 65 years old or older, or if you are at risk for osteoporosis and fractures, ask your health care provider if you should:  Be screened for bone loss.  Take a calcium or vitamin D supplement to lower your risk of fractures.  Be given hormone replacement therapy (HRT) to treat symptoms of menopause. Follow these instructions at home: Lifestyle  Do not use any products that contain nicotine or tobacco, such as cigarettes, e-cigarettes, and chewing tobacco. If you need help quitting, ask your health care provider.  Do not use street drugs.  Do not share needles.  Ask your health care provider for help if you need support or information about quitting drugs. Alcohol use  Do not drink alcohol if: ? Your health care provider tells you not to drink. ? You are pregnant, may be pregnant, or are planning to become pregnant.  If you drink alcohol: ? Limit how much you use to 0-1 drink a day. ? Limit intake if you are breastfeeding.  Be aware of how much alcohol is in your drink. In the [place] one drink equals one 12 oz bottle of beer (355 mL), one 5 oz glass of wine (148 mL), or one 1 oz glass of hard liquor (44 mL). General instructions  Schedule regular health, dental, and eye exams.  Stay current with your vaccines.  Tell your health care provider if: ? You often feel depressed. ? You have ever been abused or do not feel safe at home. Summary  Adopting a healthy lifestyle and getting preventive care are important in promoting health and wellness.  Follow your health care provider's instructions about healthy  diet, exercising, and getting tested or screened for diseases.  Follow your health care provider's instructions on monitoring your cholesterol and blood pressure. This information is not intended to replace advice given to you by your health care provider. Make sure you discuss any questions you have with your health care provider. Document Revised: 01/20/2018 Document Reviewed: 01/20/2018 Elsevier Patient Education  2020 Elsevier Inc.  

## 2019-08-17 NOTE — Progress Notes (Signed)
Office Note 08/17/2019  CC:  Chief Complaint  Patient presents with  . Annual Exam    pt is not fasting   HPI:  Colleen Ruiz is a 54 y.o. Black female who is here for annual health maintenance exam.  She has been working for [employer] from home for 4 yrs now. Family doing well, 58 mo old grandbaby.  Son is out Regulatory affairs officer now.  Exercise: not ideal. Diet: trying to improve it. Getting hysterectomy for DUB in about 2 mo.  Has fibroids.  She is not sure if she is getting her ovaries out or not. She takes 2 OTC iron tabs (called blood booster, from [facility]) daily---approx 1-2 months now.  Continues to take lorazepam and ambien on PRN basis. PMP AWARE reviewed today: most recent rx for ambien 10mg  was filled 07/28/19, # 30, rx by me. Most recent lorazepam 2mg  rx filled 12/02/18, #30, rx by me. No red flags.   Past Medical History:  Diagnosis Date  . Anemia   . Anxiety   . History of hematuria   . History of sexually transmitted disease    trich, gonorrhea, lice  . Insomnia   . Menopausal symptom    flushing and vag dryness->HRT 2020 per gyn  . Migraine syndrome   . Seasonal allergies   . Urinary tract bacterial infections   . Vitamin D deficiency    Vit D into normal range only with twice weekly 50K U vit D tab.    Past Surgical History:  Procedure Laterality Date  . CESAREAN SECTION  1990  . EYE SURGERY     Lasik  . TUBAL LIGATION    . TYMPANOPLASTY  2010    Family History  Problem Relation Age of Onset  . Hypertension Mother   . Dementia Mother   . Heart disease Father   . Cancer Father   . Breast cancer Paternal Aunt        59's     Social History   Socioeconomic History  . Marital status: Married    Spouse name: Not on file  . Number of children: 2  . Years of education: Not on file  . Highest education level: Not on file  Occupational History    Employer: [employer]  Tobacco Use  . Smoking status: Never Smoker  . Smokeless  tobacco: Never Used  Substance and Sexual Activity  . Alcohol use: No  . Drug use: No  . Sexual activity: Yes    Birth control/protection: Surgical  Other Topics Concern  . 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 Her son serves in the [name] and is currently serving in [employer].  She will be going to [place] 04/2014 to visit him.  Her daughter studied psychology at [employer] and has returned for graduate studies.    No T/A/Ds.   Social Determinants of Health   Financial Resource Strain:   . Difficulty of Paying Living Expenses:   Food Insecurity:   . Worried About 05/2014 in the Last Year:   . [facility] in the Last Year:   Transportation Needs:   . Programme researcher, broadcasting/film/video (Medical):   Barista Lack of Transportation (Non-Medical):   Physical Activity:   . Days of Exercise per Week:   . Minutes of Exercise per Session:   Stress:   . Feeling of Stress :  Social Connections:   . Frequency of Communication with Friends and Family:   . Frequency of Social Gatherings with Friends and Family:   . Attends Religious Services:   . Active Member of Clubs or Organizations:   . Attends Banker Meetings:   Marland Kitchen Marital Status:   Intimate Partner Violence:   . Fear of Current or Ex-Partner:   . Emotionally Abused:   Marland Kitchen Physically Abused:   . Sexually Abused:     Outpatient Medications Prior to Visit  Medication Sig Dispense Refill  . ibuprofen (ADVIL) 800 MG tablet Take 800 mg by mouth 3 (three) times daily.    . Multiple Vitamin (MULTIVITAMIN) tablet Take 1 tablet by mouth daily.      . norethindrone (AYGESTIN) 5 MG tablet Take by mouth.    . Vitamin D, Ergocalciferol, (DRISDOL) 1.25 MG (50000 UT) CAPS capsule TAKE 1 CAPSULE ON MONDAYS AND FRIDAYS OF EACH WEEK 24 capsule 1  . zolpidem (AMBIEN) 10 MG tablet 1 tab po qhs prn insomnia 30 tablet 3  . benzonatate (TESSALON) 200 MG  capsule Take 1 capsule (200 mg total) by mouth 3 (three) times daily as needed for cough. (Patient not taking: Reported on 08/17/2019) 30 capsule 0  . LORazepam (ATIVAN) 2 MG tablet 1/2 -1 tab po bid prn anxiety (Patient not taking: Reported on 08/17/2019) 30 tablet 3  . estradiol (CLIMARA - DOSED IN MG/24 HR) 0.05 mg/24hr patch Place 0.1 mg onto the skin once a week.  (Patient not taking: Reported on 08/17/2019)     No facility-administered medications prior to visit.    Allergies  Allergen Reactions  . Sulfonamide Derivatives     REACTION: Hives  . Penicillins Rash    ROS Review of Systems  Constitutional: Negative for appetite change, chills, fatigue and fever.  HENT: Negative for congestion, dental problem, ear pain and sore throat.   Eyes: Negative for discharge, redness and visual disturbance.  Respiratory: Negative for cough, chest tightness, shortness of breath and wheezing.   Cardiovascular: Negative for chest pain, palpitations and leg swelling.  Gastrointestinal: Negative for abdominal pain, blood in stool, diarrhea, nausea and vomiting.  Genitourinary: Negative for difficulty urinating, dysuria, flank pain, frequency, hematuria and urgency.  Musculoskeletal: Negative for arthralgias, back pain, joint swelling, myalgias and neck stiffness.  Skin: Negative for pallor and rash.  Neurological: Negative for dizziness, speech difficulty, weakness and headaches.  Hematological: Negative for adenopathy. Does not bruise/bleed easily.  Psychiatric/Behavioral: Negative for confusion and sleep disturbance. The patient is not nervous/anxious.     PE; Vitals with BMI 08/17/2019 07/21/2019 07/21/2019  Height 5\' 9"  - 5\' 10"   Weight 185 lbs 6 oz - 183 lbs  BMI 27.37 - 26.26  Systolic 119 119  Diastolic 79 78 86  Pulse 66 69 72  O2 sat on RA today is 98%  Exam chaperoned by , CMA. Gen: Alert, well appearing.  Patient is oriented to person, place, time, and  situation. AFFECT: pleasant, lucid thought and speech. ENT: Ears: EACs clear, normal epithelium.  TMs with good light reflex and landmarks bilaterally.  Eyes: no injection, icteris, swelling, or exudate.  EOMI, PERRLA. Nose: no drainage or turbinate edema/swelling.  No injection or focal lesion.  Mouth: lips without lesion/swelling.  Oral mucosa pink and moist.  Dentition intact and without obvious caries or gingival swelling.  Oropharynx without erythema, exudate, or swelling.  Neck: supple/nontender.  No LAD, mass, or TM.  Carotid pulses 2+ bilaterally, without bruits.  CV: RRR, no m/r/g.   LUNGS: CTA bilat, nonlabored resps, good aeration in all lung fields. ABD: soft, NT, ND, BS normal.  No hepatospenomegaly or mass.  No bruits. EXT: no clubbing, cyanosis, or edema.  Musculoskeletal: no joint swelling, erythema, warmth, or tenderness.  ROM of all joints intact. Skin - no sores or suspicious lesions or rashes or color changes   Pertinent labs:  Lab Results  Component Value Date   TSH 1.18 04/08/2016   Lab Results  Component Value Date   WBC 5.0 07/21/2019   HGB 11.6 (L) 07/21/2019   HCT 36.0 07/21/2019   MCV 87.6 07/21/2019   PLT 226 07/21/2019   Lab Results  Component Value Date   IRON 61 10/27/2016   TIBC 278 10/27/2016   FERRITIN 41 10/27/2016    Lab Results  Component Value Date   CREATININE 0.68 07/21/2019   BUN 8 07/21/2019   NA 139 07/21/2019   K 4.1 07/21/2019   CL 106 07/21/2019   CO2 25 07/21/2019   Lab Results  Component Value Date   ALT 9 10/27/2016   AST 13 10/27/2016   ALKPHOS 85 04/08/2016   BILITOT 0.3 10/27/2016   Lab Results  Component Value Date   CHOL 183 04/08/2016   Lab Results  Component Value Date   HDL 59.20 04/08/2016   Lab Results  Component Value Date   LDLCALC 112 (H) 04/08/2016   Lab Results  Component Value Date   TRIG 59.0 04/08/2016   Lab Results  Component Value Date   CHOLHDL 3 04/08/2016   Lab Results   Component Value Date   HGBA1C 5.4 08/15/2014     ASSESSMENT AND PLAN:   Health maintenance exam: Reviewed age and gender appropriate health maintenance issues (prudent diet, regular exercise, health risks of tobacco and excessive alcohol, use of seatbelts, fire alarms in home, use of sunscreen).  Also reviewed age and gender appropriate health screening as well as vaccine recommendations. Vaccines: Tdap is due->given today.  Covid 19 UTD. Labs: fasting HP labs + vit D level (hx of vit D def) + iron panel (normocytic anemia + DUB) ordered-->return for fasting lab visit. Cervical ca screening: last pap in EMR is 04/2017--->she is scheduled to have hysterectomy 10/2019. Breast ca screening: screening mammogram due this month->via GYN MD. Colon ca screening: colonoscopy was done by [facility] this year---all normal.  DUB: taking iron daily for the last 1-2 mo, is due to get hysterectomy Sept this year.  Vit D def: continue 50K U tab twice per week. Vit D level ordered--future.  Insomnia and anxiety: stable. Ambien rx today--see orders.  An After Visit Summary was printed and given to the patient.  FOLLOW UP:  Return in about 6 months (around 02/17/2020) for routine chronic illness f/u.  Signed:  Santiago Bumpers, MD           08/17/2019

## 2019-08-17 NOTE — Addendum Note (Signed)
Addended by: Eulah Pont on: 08/17/2019 02:31 PM   Modules accepted: Orders

## 2019-08-26 ENCOUNTER — Ambulatory Visit: Payer: No Typology Code available for payment source

## 2019-08-26 ENCOUNTER — Telehealth: Payer: Self-pay

## 2019-08-26 NOTE — Telephone Encounter (Signed)
LM for pt to returncall

## 2019-08-26 NOTE — Telephone Encounter (Signed)
Patient requesting something for a cough. She was seen the other day by Dr. Milinda Cave  benzonatate (TESSALON) 100 MG capsule [[id number]]    Patient can be reached at [phone]

## 2019-08-26 NOTE — Telephone Encounter (Signed)
Rx last given 02/09/19 for #30. Pt was last seen last week for CPE. Please advise, thanks.

## 2019-08-26 NOTE — Telephone Encounter (Signed)
Has she tried any OTC cough med? Can't just call in rx cough med w/out assessing her for the cough. If she hasn't tried otc med then advise her to try mucinex DM OTC as directed on packaging.

## 2019-08-29 NOTE — Telephone Encounter (Signed)
Patient advised of recommendations. If no improvement, will call back and get her scheduled for appt for further evaluation.

## 2019-09-06 ENCOUNTER — Telehealth (INDEPENDENT_AMBULATORY_CARE_PROVIDER_SITE_OTHER): Payer: No Typology Code available for payment source | Admitting: Family Medicine

## 2019-09-06 ENCOUNTER — Encounter: Payer: Self-pay | Admitting: Family Medicine

## 2019-09-06 ENCOUNTER — Other Ambulatory Visit: Payer: Self-pay

## 2019-09-06 VITALS — Wt 184.0 lb

## 2019-09-06 DIAGNOSIS — R05 Cough: Secondary | ICD-10-CM | POA: Diagnosis not present

## 2019-09-06 DIAGNOSIS — R059 Cough, unspecified: Secondary | ICD-10-CM

## 2019-09-06 DIAGNOSIS — J9801 Acute bronchospasm: Secondary | ICD-10-CM

## 2019-09-06 DIAGNOSIS — R058 Other specified cough: Secondary | ICD-10-CM

## 2019-09-06 MED ORDER — ALBUTEROL SULFATE HFA 108 (90 BASE) MCG/ACT IN AERS
2.0000 | INHALATION_SPRAY | RESPIRATORY_TRACT | 0 refills | Status: DC | PRN
Start: 2019-09-06 — End: 2019-11-03

## 2019-09-06 MED ORDER — BENZONATATE 200 MG PO CAPS
200.0000 mg | ORAL_CAPSULE | Freq: Two times a day (BID) | ORAL | 0 refills | Status: DC | PRN
Start: 2019-09-06 — End: 2019-11-03

## 2019-09-06 NOTE — Progress Notes (Signed)
Virtual Visit via Video Note  I connected with Colleen Ruiz on 09/06/19 at 11:30 AM EDT by a video enabled telemedicine application and verified that I am speaking with the correct person using two identifiers.  Location patient: home Location provider:work or home office Persons participating in the virtual visit: patient, provider  I discussed the limitations of evaluation and management by telemedicine and the availability of in person appointments. The patient expressed understanding and agreed to proceed.   HPI: 54 y/o female being seen today for cough. Onset about 12-13 days ago, dry/nonproductive.  No nasal cong/runny nose, no ST. Cough comes more when talking.  Taking mucinex no help, nyquil, zyrtec, honey, lemon all no help.  She had some tessalon pearls leftover from prior rx and these did not help either.  No f/c, no SOB, no wheezing. Some occ feeling of chest tightness when lying back in her bed.   Covid test was NEG. Today it is signif better. Says she often gets this kind of cough a couple times a year.  DEnies GERD sx's.   ROS: See pertinent positives and negatives per HPI.  Past Medical History:  Diagnosis Date  . Anemia   . Anxiety   . History of hematuria   . History of sexually transmitted disease    trich, gonorrhea, lice  . Insomnia   . Menopausal symptom    flushing and vag dryness->HRT 2020 per gyn  . Migraine syndrome   . Seasonal allergies   . Urinary tract bacterial infections   . Vitamin D deficiency    Vit D into normal range only with twice weekly 50K U vit D tab.    Past Surgical History:  Procedure Laterality Date  . CESAREAN SECTION  1990  . EYE SURGERY     Lasik  . TUBAL LIGATION    . TYMPANOPLASTY  2010    Family History  Problem Relation Age of Onset  . Hypertension Mother   . Dementia Mother   . Heart disease Father   . Cancer Father   . Breast cancer Paternal Aunt        25's     Current Outpatient Medications:  .  LORazepam  (ATIVAN) 2 MG tablet, 1/2 -1 tab po bid prn anxiety, Disp: 30 tablet, Rfl: 3 .  Multiple Vitamin (MULTIVITAMIN) tablet, Take 1 tablet by mouth daily.  , Disp: , Rfl:  .  norethindrone (AYGESTIN) 5 MG tablet, Take by mouth., Disp: , Rfl:  .  zolpidem (AMBIEN) 10 MG tablet, 1 tab po qhs prn insomnia, Disp: 30 tablet, Rfl: 3 .  ibuprofen (ADVIL) 800 MG tablet, Take 800 mg by mouth 3 (three) times daily. (Patient not taking: Reported on 09/06/2019), Disp: , Rfl:  .  Vitamin D, Ergocalciferol, (DRISDOL) 1.25 MG (50000 UT) CAPS capsule, TAKE 1 CAPSULE ON MONDAYS AND FRIDAYS OF EACH WEEK (Patient not taking: Reported on 09/06/2019), Disp: 24 capsule, Rfl: 1  EXAM:  VITALS per patient if applicable:  Vitals with BMI 09/06/2019 08/17/2019 07/21/2019  Height - 5\' 9"  -  Weight 184 lbs 185 lbs 6 oz -  BMI 27.16 27.37 -  Systolic - 119 119  Diastolic - 79 78  Pulse - 66 69    GENERAL: alert, oriented, appears well and in no acute distress  HEENT: atraumatic, conjunttiva clear, no obvious abnormalities on inspection of external nose and ears  NECK: normal movements of the head and neck  LUNGS: on inspection no signs of respiratory distress, breathing rate  appears normal, no obvious gross SOB, gasping or wheezing  CV: no obvious cyanosis  MS: moves all visible extremities without noticeable abnormality  PSYCH/NEURO: pleasant and cooperative, no obvious depression or anxiety, speech and thought processing grossly intact  LABS: none today.    Chemistry      Component Value Date/Time   NA 139 07/21/2019 1035   K 4.1 07/21/2019 1035   CL 106 07/21/2019 1035   CO2 25 07/21/2019 1035   BUN 8 07/21/2019 1035   CREATININE 0.68 07/21/2019 1035   CREATININE 0.68 10/27/2016 1542      Component Value Date/Time   CALCIUM 8.7 (L) 07/21/2019 1035   ALKPHOS 85 04/08/2016 1105   AST 13 10/27/2016 1542   ALT 9 10/27/2016 1542   BILITOT 0.3 10/27/2016 1542      ASSESSMENT AND PLAN:  Discussed the  following assessment and plan:  1) cough; suspect bronchospasm.  Also with some upper airway cough syndrome. Trial of albuterol HFA 2p q4h prn-rx'd. Tessalon 200mg  tid prn rx'd. Get otc generic prevacid and take 1 qd x 14d. Stop cough drops.  No indication for steroids, abx, blood work, or imaging at this time.  -we discussed possible serious and likely etiologies, options for evaluation and workup, limitations of telemedicine visit vs in person visit, treatment, treatment risks and precautions. Colleen Ruiz prefers to treat via telemedicine empirically rather then risking or undertaking an in person visit at this moment. Patient agrees to seek prompt in person care if worsening, new symptoms arise, or if is not improving with treatment.   I discussed the assessment and treatment plan with the patient. The patient was provided an opportunity to ask questions and all were answered. The patient agreed with the plan and demonstrated an understanding of the instructions.   The patient was advised to call back or seek an in-person evaluation if the symptoms worsen or if the condition fails to improve as anticipated.  F/u: if not improving signif in 3-4d  Signed:  , MD           09/06/2019

## 2019-09-12 ENCOUNTER — Encounter: Payer: Self-pay | Admitting: Family Medicine

## 2019-10-25 NOTE — Patient Instructions (Addendum)
DUE TO COVID-19 ONLY ONE VISITOR IS ALLOWED IN WAITING ROOM (VISITOR WILL HAVE A TEMPERATURE CHECK ON ARRIVAL AND MUST WEAR A FACE MASK THE ENTIRE TIME.)  ONCE YOU ARE ADMITTED TO YOUR PRIVATE ROOM, THE SAME ONE VISITOR IS ALLOWED TO VISIT DURING VISITING HOURS ONLY.  Your COVID swab testing is scheduled for: 10/31/19 at 8:50 am  , You must self quarantine after your testing per handout given to you at the testing site. [address] [place], [place] [address]  (Must self quarantine after testing. Follow instructions on handout.)       Your procedure is scheduled on:11/03/19  Report to [facility] [place] AT: 6:00 am  A. M.   Call this number if you have problems the morning of surgery:[phone].   OUR ADDRESS IS [address].  WE ARE LOCATED IN THE [place]                                   [facility].                                     REMEMBER:   DO NOT EAT SOLID FOOD AFTER MIDNIGHT.   BRUSH YOUR TEETH THE MORNING OF SURGERY.  TAKE THESE MEDICATIONS MORNING OF SURGERY WITH A SIP OF WATER:    DO NOT WEAR JEWERLY, MAKE UP, OR NAIL POLISH.  DO NOT WEAR LOTIONS, POWDERS, PERFUMES/COLOGNE OR DEODORANT.  DO NOT SHAVE FOR 24 HOURS PRIOR TO DAY OF SURGERY.   CONTACTS, GLASSES, OR DENTURES MAY NOT BE WORN TO SURGERY.                                    [facility] IS NOT RESPONSIBLE  FOR ANY BELONGINGS.                                                                    [facility] - Preparing for Surgery Before surgery, you can play an important role.  Because skin is not sterile, your skin needs to be as free of germs as possible.  You can reduce the number of germs on your skin by washing with CHG (chlorahexidine gluconate) soap before surgery.  CHG is an antiseptic cleaner which kills germs and bonds with the skin to continue killing germs even after washing. Please DO NOT use if you have an allergy to CHG or antibacterial soaps.  If your skin becomes  reddened/irritated stop using the CHG and inform your nurse when you arrive at Short Stay. Do not shave (including legs and underarms) for at least 48 hours prior to the first CHG shower.  You may shave your face/neck. Please follow these instructions carefully:  1.  Shower with CHG Soap the night before surgery and the  morning of Surgery.  2.  If you choose to wash your hair, wash your hair first as usual with your  normal  shampoo.  3.  After you shampoo, rinse your hair and body thoroughly to remove the  shampoo.                           4.  Use CHG as you would any other liquid soap.  You can apply chg directly  to the skin and wash                       Gently with a scrungie or clean washcloth.  5.  Apply the CHG Soap to your body ONLY FROM THE NECK DOWN.   Do not use on face/ open                           Wound or open sores. Avoid contact with eyes, ears mouth and genitals (private parts).                       Wash face,  Genitals (private parts) with your normal soap.             6.  Wash thoroughly, paying special attention to the area where your surgery  will be performed.  7.  Thoroughly rinse your body with warm water from the neck down.  8.  DO NOT shower/wash with your normal soap after using and rinsing off  the CHG Soap.                9.  Pat yourself dry with a clean towel.            10.  Wear clean pajamas.            11.  Place clean sheets on your bed the night of your first shower and do not  sleep with pets. Day of Surgery : Do not apply any lotions/deodorants the morning of surgery.  Please wear clean clothes to the hospital/surgery center.  FAILURE TO FOLLOW THESE INSTRUCTIONS MAY RESULT IN THE CANCELLATION OF YOUR SURGERY PATIENT SIGNATURE_________________________________  NURSE SIGNATURE__________________________________  ___________________________________________________________________     Colleen Ruiz MAY BRING A SMALL OVERNIGHT BAG   Incentive  Spirometer  An incentive spirometer is a tool that can help keep your lungs clear and active. This tool measures how well you are filling your lungs with each breath. Taking long deep breaths may help reverse or decrease the chance of developing breathing (pulmonary) problems (especially infection) following:  A long period of time when you are unable to move or be active. BEFORE THE PROCEDURE   If the spirometer includes an indicator to show your best effort, your nurse or respiratory therapist will set it to a desired goal.  If possible, sit up straight or lean slightly forward. Try not to slouch.  Hold the incentive spirometer in an upright position. INSTRUCTIONS FOR USE  1. Sit on the edge of your bed if possible, or sit up as far as you can in bed or on a chair. 2. Hold the incentive spirometer in an upright position. 3. Breathe out normally. 4. Place the mouthpiece in your mouth and seal your lips tightly around it. 5. Breathe in slowly and as deeply as possible, raising the piston or the ball toward the top of the column. 6. Hold your breath for 3-5 seconds or for as long as possible. Allow the piston or ball to fall to the bottom of the column. 7. Remove the mouthpiece from your mouth and breathe out normally. 8. Rest for a few seconds  and repeat Steps 1 through 7 at least 10 times every 1-2 hours when you are awake. Take your time and take a few normal breaths between deep breaths. 9. The spirometer may include an indicator to show your best effort. Use the indicator as a goal to work toward during each repetition. 10. After each set of 10 deep breaths, practice coughing to be sure your lungs are clear. If you have an incision (the cut made at the time of surgery), support your incision when coughing by placing a pillow or rolled up towels firmly against it. Once you are able to get out of bed, walk around indoors and cough well. You may stop using the incentive spirometer when  instructed by your caregiver.  RISKS AND COMPLICATIONS  Take your time so you do not get dizzy or light-headed.  If you are in pain, you may need to take or ask for pain medication before doing incentive spirometry. It is harder to take a deep breath if you are having pain. AFTER USE  Rest and breathe slowly and easily.  It can be helpful to keep track of a log of your progress. Your caregiver can provide you with a simple table to help with this. If you are using the spirometer at home, follow these instructions: SEEK MEDICAL CARE IF:   You are having difficultly using the spirometer.  You have trouble using the spirometer as often as instructed.  Your pain medication is not giving enough relief while using the spirometer.  You develop fever of 100.5 F (38.1 C) or higher. SEEK IMMEDIATE MEDICAL CARE IF:   You cough up bloody sputum that had not been present before.  You develop fever of 102 F (38.9 C) or greater.  You develop worsening pain at or near the incision site. MAKE SURE YOU:   Understand these instructions.  Will watch your condition.  Will get help right away if you are not doing well or get worse. Document Released: 06/09/2006 Document Revised: 04/21/2011 Document Reviewed: 08/10/2006 [facility] Patient Information 2014 [place], [place].   ________________________________________________________________________

## 2019-10-26 ENCOUNTER — Other Ambulatory Visit: Payer: Self-pay

## 2019-10-26 ENCOUNTER — Encounter (HOSPITAL_COMMUNITY): Payer: Self-pay

## 2019-10-26 ENCOUNTER — Encounter (HOSPITAL_COMMUNITY)
Admission: RE | Admit: 2019-10-26 | Discharge: 2019-10-26 | Disposition: A | Discharge status: Home / Self Care | Payer: No Typology Code available for payment source | Source: Ambulatory Visit | Attending: Obstetrics and Gynecology | Admitting: Obstetrics and Gynecology

## 2019-10-26 DIAGNOSIS — Z01812 Encounter for preprocedural laboratory examination: Secondary | ICD-10-CM | POA: Diagnosis not present

## 2019-10-26 LAB — CBC
HCT: 38.6 % (ref 36.0–46.0)
Hemoglobin: 12.8 g/dL (ref 12.0–15.0)
MCH: 28.8 pg (ref 26.0–34.0)
MCHC: 33.2 g/dL (ref 30.0–36.0)
MCV: 86.9 fL (ref 80.0–100.0)
Platelets: 278 10*3/uL (ref 150–400)
RBC: 4.44 MIL/uL (ref 3.87–5.11)
RDW: 13.1 % (ref 11.5–15.5)
WBC: 3.7 10*3/uL — ABNORMAL LOW (ref 4.0–10.5)
nRBC: 0 % (ref 0.0–0.2)

## 2019-10-26 LAB — COMPREHENSIVE METABOLIC PANEL
ALT: 17 U/L (ref 0–44)
AST: 16 U/L (ref 15–41)
Albumin: 3.4 g/dL — ABNORMAL LOW (ref 3.5–5.0)
Alkaline Phosphatase: 44 U/L (ref 38–126)
Anion gap: 8 (ref 5–15)
BUN: 8 mg/dL (ref 6–20)
CO2: 25 mmol/L (ref 22–32)
Calcium: 8.8 mg/dL — ABNORMAL LOW (ref 8.9–10.3)
Chloride: 108 mmol/L (ref 98–111)
Creatinine, Ser: 0.82 mg/dL (ref 0.44–1.00)
GFR calc Af Amer: >60 mL/min (ref >60)
GFR calc non Af Amer: >60 mL/min (ref >60)
Glucose, Bld: 90 mg/dL (ref 70–99)
Potassium: 4.1 mmol/L (ref 3.5–5.1)
Sodium: 141 mmol/L (ref 135–145)
Total Bilirubin: 0.6 mg/dL (ref 0.3–1.2)
Total Protein: 7 g/dL (ref 6.5–8.1)

## 2019-10-26 NOTE — Progress Notes (Signed)
COVID Vaccine Completed: yes Date COVID Vaccine completed: 06/13/19 COVID vaccine manufacturer:   [employer]   PCP - Dr. Lovena Neighbours. LOV: 08/17/19 Cardiologist -   Chest x-ray -  EKG -  Stress Test -  ECHO -  Cardiac Cath -  Pacemaker/ICD device last checked:  Sleep Study -  CPAP -   Fasting Blood Sugar -  Checks Blood Sugar _____ times a day  Blood Thinner Instructions: Aspirin Instructions: Last Dose:  Anesthesia review:   Patient denies shortness of breath, fever, cough and chest pain at PAT appointment   Patient verbalized understanding of instructions that were given to them at the PAT appointment. Patient was also instructed that they will need to review over the PAT instructions again at home before surgery.

## 2019-10-31 ENCOUNTER — Other Ambulatory Visit (HOSPITAL_COMMUNITY)
Admission: RE | Admit: 2019-10-31 | Discharge: 2019-10-31 | Disposition: A | Discharge status: Home / Self Care | Payer: No Typology Code available for payment source | Source: Ambulatory Visit | Attending: Obstetrics and Gynecology | Admitting: Obstetrics and Gynecology

## 2019-10-31 DIAGNOSIS — Z01812 Encounter for preprocedural laboratory examination: Secondary | ICD-10-CM | POA: Diagnosis not present

## 2019-10-31 DIAGNOSIS — Z20822 Contact with and (suspected) exposure to covid-19: Secondary | ICD-10-CM | POA: Insufficient documentation

## 2019-10-31 LAB — SARS CORONAVIRUS 2 (TAT 6-24 HRS): SARS Coronavirus 2: NEGATIVE

## 2019-11-01 NOTE — H&P (Signed)
Colleen Ruiz is an 54 y.o. femaleG3P2012 with AUB/perimenopausal bleeding, also menopausal symptoms.  Had benign EMB.  Desires definitive management.  D/W pt r/b/a and process of LAVH/BS and cysto.  Has been using femring to control menopause symptoms.    Pertinent Gynecological History:  No abn pap H/o +Chl, trich and crabs Nl MMG  G3P2012 SAB SVD x 2  Menstrual History:  No LMP recorded. Patient is perimenopausal.    Past Medical History:  Diagnosis Date  . Abnormal uterine bleeding    endometrial bx 07/2019. To get Lap vag hyst 10/2019  . Anemia   . Anxiety   . History of hematuria   . History of sexually transmitted disease    trich, gonorrhea, lice  . Insomnia   . Menopausal symptom    flushing and vag dryness->HRT 2020 per gyn  . Migraine syndrome   . Seasonal allergies   . Urinary tract bacterial infections   . Vitamin D deficiency    Vit D into normal range only with twice weekly 50K U vit D tab.    Past Surgical History:  Procedure Laterality Date  . CESAREAN SECTION  1990  . EYE SURGERY     Lasik  . TUBAL LIGATION    . TYMPANOPLASTY  2010    Family History  Problem Relation Age of Onset  . Hypertension Mother   . Dementia Mother   . Heart disease Father   . Cancer Father   . Breast cancer Paternal Aunt        61's     Social History:  reports that she has never smoked. She has never used smokeless tobacco. She reports that she does not drink alcohol and does not use drugs.married  Allergies:  Allergies  Allergen Reactions  . Sulfonamide Derivatives Hives  . Penicillins Rash    Meds: ibuprofen, Ambien, Femring    Review of Systems  Constitutional: Negative.   HENT: Negative.   Eyes: Negative.   Respiratory: Negative.   Cardiovascular: Negative.   Gastrointestinal: Negative.   Genitourinary: Negative.   Musculoskeletal: Negative.   Skin: Negative.   Neurological: Negative.   Psychiatric/Behavioral: Negative.     There were no  vitals taken for this visit. Physical Exam Constitutional:      Appearance: Normal appearance.  HENT:     Head: Normocephalic and atraumatic.  Cardiovascular:     Rate and Rhythm: Normal rate and regular rhythm.  Pulmonary:     Effort: Pulmonary effort is normal.     Breath sounds: Normal breath sounds.  Abdominal:     General: Bowel sounds are normal.     Palpations: Abdomen is soft.  Genitourinary:    General: Normal vulva.  Musculoskeletal:        General: Normal range of motion.     Cervical back: Normal range of motion and neck supple.  Skin:    General: Skin is warm and dry.  Neurological:     General: No focal deficit present.     Mental Status: She is alert and oriented to person, place, and time.  Psychiatric:        Mood and Affect: Mood normal.        Behavior: Behavior normal.     EMB benign  Assessment/Plan: 53yo  [id number] with perimenopausal/AUB for LAVH/BS and cystoscopy Gent/clinda for prophylaxis D/w pt r/b/a of surgery  Siyah Mault Bovard-Stuckert 11/01/2019, 8:21 PM

## 2019-11-02 ENCOUNTER — Encounter: Payer: Self-pay | Admitting: Family Medicine

## 2019-11-02 MED ORDER — CLINDAMYCIN PHOSPHATE 900 MG/50ML IV SOLN
900.0000 mg | INTRAVENOUS | Status: AC
  Administered 2019-11-03: 900 mg via INTRAVENOUS

## 2019-11-02 MED ORDER — GENTAMICIN SULFATE 40 MG/ML IJ SOLN
5.0000 mg/kg | INTRAVENOUS | Status: AC
  Administered 2019-11-03: 433.2 mg via INTRAVENOUS
  Filled 2019-11-02: qty 10.75

## 2019-11-03 ENCOUNTER — Observation Stay (HOSPITAL_BASED_OUTPATIENT_CLINIC_OR_DEPARTMENT_OTHER): Payer: No Typology Code available for payment source | Admitting: Anesthesiology

## 2019-11-03 ENCOUNTER — Encounter (HOSPITAL_BASED_OUTPATIENT_CLINIC_OR_DEPARTMENT_OTHER)
Admission: RE | Disposition: A | Discharge status: Home / Self Care | Payer: Self-pay | Source: Home / Self Care | Attending: Obstetrics and Gynecology

## 2019-11-03 ENCOUNTER — Encounter (HOSPITAL_BASED_OUTPATIENT_CLINIC_OR_DEPARTMENT_OTHER): Payer: Self-pay | Admitting: Obstetrics and Gynecology

## 2019-11-03 ENCOUNTER — Observation Stay (HOSPITAL_BASED_OUTPATIENT_CLINIC_OR_DEPARTMENT_OTHER)
Admission: RE | Admit: 2019-11-03 | Discharge: 2019-11-04 | Disposition: A | Discharge status: Home / Self Care | Payer: No Typology Code available for payment source | Attending: Obstetrics and Gynecology | Admitting: Obstetrics and Gynecology

## 2019-11-03 ENCOUNTER — Other Ambulatory Visit: Payer: Self-pay

## 2019-11-03 DIAGNOSIS — N939 Abnormal uterine and vaginal bleeding, unspecified: Principal | ICD-10-CM | POA: Insufficient documentation

## 2019-11-03 DIAGNOSIS — G4700 Insomnia, unspecified: Secondary | ICD-10-CM | POA: Insufficient documentation

## 2019-11-03 DIAGNOSIS — F419 Anxiety disorder, unspecified: Secondary | ICD-10-CM | POA: Diagnosis not present

## 2019-11-03 DIAGNOSIS — Z90710 Acquired absence of both cervix and uterus: Secondary | ICD-10-CM | POA: Diagnosis present

## 2019-11-03 DIAGNOSIS — D649 Anemia, unspecified: Secondary | ICD-10-CM | POA: Insufficient documentation

## 2019-11-03 DIAGNOSIS — G43909 Migraine, unspecified, not intractable, without status migrainosus: Secondary | ICD-10-CM | POA: Insufficient documentation

## 2019-11-03 HISTORY — PX: CYSTOSCOPY: SHX5120

## 2019-11-03 HISTORY — DX: Acquired absence of both cervix and uterus: Z90.710

## 2019-11-03 HISTORY — PX: LAPAROSCOPIC VAGINAL HYSTERECTOMY WITH SALPINGECTOMY: SHX6680

## 2019-11-03 LAB — TYPE AND SCREEN
ABO/RH(D): B POS
Antibody Screen: NEGATIVE

## 2019-11-03 LAB — CBC
HCT: 29.4 % — ABNORMAL LOW (ref 36.0–46.0)
Hemoglobin: 9.8 g/dL — ABNORMAL LOW (ref 12.0–15.0)
MCH: 29.1 pg (ref 26.0–34.0)
MCHC: 33.3 g/dL (ref 30.0–36.0)
MCV: 87.2 fL (ref 80.0–100.0)
Platelets: 225 10*3/uL (ref 150–400)
RBC: 3.37 MIL/uL — ABNORMAL LOW (ref 3.87–5.11)
RDW: 13.1 % (ref 11.5–15.5)
WBC: 10.9 10*3/uL — ABNORMAL HIGH (ref 4.0–10.5)
nRBC: 0 % (ref 0.0–0.2)

## 2019-11-03 LAB — POCT PREGNANCY, URINE: Preg Test, Ur: NEGATIVE

## 2019-11-03 LAB — ABO/RH: ABO/RH(D): B POS

## 2019-11-03 LAB — POCT HEMOGLOBIN-HEMACUE: Hemoglobin: 8.6 g/dL — ABNORMAL LOW (ref 12.0–15.0)

## 2019-11-03 SURGERY — HYSTERECTOMY, VAGINAL, LAPAROSCOPY-ASSISTED, WITH SALPINGECTOMY
Anesthesia: General | Site: Abdomen

## 2019-11-03 MED ORDER — HYDROMORPHONE HCL 1 MG/ML IJ SOLN
INTRAMUSCULAR | Status: AC
Start: 2019-11-03 — End: ?
  Filled 2019-11-03: qty 1

## 2019-11-03 MED ORDER — ONDANSETRON HCL 4 MG/2ML IJ SOLN: 4.0000 mg | Freq: Once | INTRAMUSCULAR | Status: DC | PRN

## 2019-11-03 MED ORDER — KETOROLAC TROMETHAMINE 30 MG/ML IJ SOLN: 30.0000 mg | Freq: Four times a day (QID) | INTRAMUSCULAR | Status: DC

## 2019-11-03 MED ORDER — KETOROLAC TROMETHAMINE 30 MG/ML IJ SOLN
INTRAMUSCULAR | Status: AC
  Filled 2019-11-03: qty 1

## 2019-11-03 MED ORDER — MIDAZOLAM HCL 5 MG/5ML IJ SOLN
INTRAMUSCULAR | Status: DC | PRN
  Administered 2019-11-03: 2 mg via INTRAVENOUS

## 2019-11-03 MED ORDER — LACTATED RINGERS IV SOLN: INTRAVENOUS | Status: DC

## 2019-11-03 MED ORDER — PHENYLEPHRINE HCL (PRESSORS) 10 MG/ML IV SOLN
INTRAVENOUS | Status: DC | PRN
  Administered 2019-11-03 (×2): 80 ug via INTRAVENOUS

## 2019-11-03 MED ORDER — BUPIVACAINE HCL (PF) 0.25 % IJ SOLN
INTRAMUSCULAR | Status: DC | PRN
  Administered 2019-11-03: 12 mL

## 2019-11-03 MED ORDER — SCOPOLAMINE 1 MG/3DAYS TD PT72
MEDICATED_PATCH | TRANSDERMAL | Status: AC
  Filled 2019-11-03: qty 1

## 2019-11-03 MED ORDER — ROCURONIUM BROMIDE 100 MG/10ML IV SOLN
INTRAVENOUS | Status: DC | PRN
  Administered 2019-11-03: 80 mg via INTRAVENOUS
  Administered 2019-11-03 (×2): 20 mg via INTRAVENOUS

## 2019-11-03 MED ORDER — DEXAMETHASONE SODIUM PHOSPHATE 4 MG/ML IJ SOLN
INTRAMUSCULAR | Status: DC | PRN
  Administered 2019-11-03: 10 mg via INTRAVENOUS

## 2019-11-03 MED ORDER — LIDOCAINE HCL (CARDIAC) PF 100 MG/5ML IV SOSY
PREFILLED_SYRINGE | INTRAVENOUS | Status: DC | PRN
  Administered 2019-11-03: 100 mg via INTRAVENOUS

## 2019-11-03 MED ORDER — OXYCODONE HCL 5 MG PO TABS: 5.0000 mg | ORAL_TABLET | Freq: Once | ORAL | Status: DC | PRN

## 2019-11-03 MED ORDER — MENTHOL 3 MG MT LOZG: 1.0000 | LOZENGE | OROMUCOSAL | Status: DC | PRN

## 2019-11-03 MED ORDER — MEPERIDINE HCL 25 MG/ML IJ SOLN: 6.2500 mg | INTRAMUSCULAR | Status: DC | PRN

## 2019-11-03 MED ORDER — LACTATED RINGERS IV SOLN
INTRAVENOUS | Status: DC
  Administered 2019-11-03: 1 mL via INTRAVENOUS

## 2019-11-03 MED ORDER — ACETAMINOPHEN 160 MG/5ML PO SOLN: 325.0000 mg | ORAL | Status: DC | PRN

## 2019-11-03 MED ORDER — FENTANYL CITRATE (PF) 100 MCG/2ML IJ SOLN: 25.0000 ug | INTRAMUSCULAR | Status: DC | PRN

## 2019-11-03 MED ORDER — DIPHENHYDRAMINE HCL 50 MG/ML IJ SOLN: 12.5000 mg | Freq: Four times a day (QID) | INTRAMUSCULAR | Status: DC | PRN

## 2019-11-03 MED ORDER — HYDROMORPHONE HCL 1 MG/ML IJ SOLN
INTRAMUSCULAR | Status: AC
  Filled 2019-11-03: qty 1

## 2019-11-03 MED ORDER — FENTANYL CITRATE (PF) 100 MCG/2ML IJ SOLN
INTRAMUSCULAR | Status: AC
  Filled 2019-11-03: qty 2

## 2019-11-03 MED ORDER — ONDANSETRON HCL 4 MG/2ML IJ SOLN
INTRAMUSCULAR | Status: DC | PRN
  Administered 2019-11-03: 4 mg via INTRAVENOUS

## 2019-11-03 MED ORDER — GUAIFENESIN 100 MG/5ML PO SOLN
15.0000 mL | ORAL | Status: DC | PRN
  Filled 2019-11-03: qty 15

## 2019-11-03 MED ORDER — ALBUMIN HUMAN 5 % IV SOLN: INTRAVENOUS | Status: DC | PRN

## 2019-11-03 MED ORDER — ACETAMINOPHEN 500 MG PO TABS
1000.0000 mg | ORAL_TABLET | ORAL | Status: AC
  Administered 2019-11-03: 1000 mg via ORAL

## 2019-11-03 MED ORDER — SCOPOLAMINE 1 MG/3DAYS TD PT72
MEDICATED_PATCH | TRANSDERMAL | Status: DC | PRN
  Administered 2019-11-03: 1 via TRANSDERMAL

## 2019-11-03 MED ORDER — OXYCODONE HCL 5 MG/5ML PO SOLN: 5.0000 mg | Freq: Once | ORAL | Status: DC | PRN

## 2019-11-03 MED ORDER — LIDOCAINE 2% (20 MG/ML) 5 ML SYRINGE
INTRAMUSCULAR | Status: AC
  Filled 2019-11-03: qty 5

## 2019-11-03 MED ORDER — HYDROMORPHONE 1 MG/ML IV SOLN
INTRAVENOUS | Status: DC
  Filled 2019-11-03: qty 30

## 2019-11-03 MED ORDER — ALUM & MAG HYDROXIDE-SIMETH 200-200-20 MG/5ML PO SUSP: 30.0000 mL | ORAL | Status: DC | PRN

## 2019-11-03 MED ORDER — PHENYLEPHRINE 40 MCG/ML (10ML) SYRINGE FOR IV PUSH (FOR BLOOD PRESSURE SUPPORT)
PREFILLED_SYRINGE | INTRAVENOUS | Status: AC
  Filled 2019-11-03: qty 10

## 2019-11-03 MED ORDER — OXYCODONE-ACETAMINOPHEN 5-325 MG PO TABS
ORAL_TABLET | ORAL | Status: AC
  Filled 2019-11-03: qty 2

## 2019-11-03 MED ORDER — SUGAMMADEX SODIUM 200 MG/2ML IV SOLN
INTRAVENOUS | Status: DC | PRN
  Administered 2019-11-03: 200 mg via INTRAVENOUS

## 2019-11-03 MED ORDER — CLINDAMYCIN PHOSPHATE 900 MG/50ML IV SOLN
INTRAVENOUS | Status: AC
  Filled 2019-11-03: qty 50

## 2019-11-03 MED ORDER — NALOXONE HCL 0.4 MG/ML IJ SOLN: 0.4000 mg | INTRAMUSCULAR | Status: DC | PRN

## 2019-11-03 MED ORDER — LACTATED RINGERS IV SOLN: INTRAVENOUS | Status: DC | PRN

## 2019-11-03 MED ORDER — HYDROMORPHONE HCL 1 MG/ML IJ SOLN
0.2000 mg | INTRAMUSCULAR | Status: DC | PRN
  Administered 2019-11-03 (×2): 0.5 mg via INTRAVENOUS

## 2019-11-03 MED ORDER — PROPOFOL 10 MG/ML IV BOLUS
INTRAVENOUS | Status: DC | PRN
  Administered 2019-11-03: 200 mg via INTRAVENOUS

## 2019-11-03 MED ORDER — LORAZEPAM 1 MG PO TABS: 1.0000 mg | ORAL_TABLET | Freq: Two times a day (BID) | ORAL | Status: DC | PRN

## 2019-11-03 MED ORDER — PROPOFOL 10 MG/ML IV BOLUS
INTRAVENOUS | Status: AC
  Filled 2019-11-03: qty 40

## 2019-11-03 MED ORDER — DEXAMETHASONE SODIUM PHOSPHATE 10 MG/ML IJ SOLN
INTRAMUSCULAR | Status: AC
  Filled 2019-11-03: qty 1

## 2019-11-03 MED ORDER — ACETAMINOPHEN 325 MG PO TABS: 325.0000 mg | ORAL_TABLET | ORAL | Status: DC | PRN

## 2019-11-03 MED ORDER — GABAPENTIN 300 MG PO CAPS
ORAL_CAPSULE | ORAL | Status: AC
  Filled 2019-11-03: qty 1

## 2019-11-03 MED ORDER — ROCURONIUM BROMIDE 10 MG/ML (PF) SYRINGE
PREFILLED_SYRINGE | INTRAVENOUS | Status: AC
  Filled 2019-11-03: qty 10

## 2019-11-03 MED ORDER — FLUORESCEIN SODIUM 10 % IV SOLN
INTRAVENOUS | Status: DC | PRN
  Administered 2019-11-03: 1 mL via INTRAVENOUS

## 2019-11-03 MED ORDER — ONDANSETRON HCL 4 MG/2ML IJ SOLN: 4.0000 mg | Freq: Four times a day (QID) | INTRAMUSCULAR | Status: DC | PRN

## 2019-11-03 MED ORDER — FENTANYL CITRATE (PF) 100 MCG/2ML IJ SOLN
INTRAMUSCULAR | Status: DC | PRN
Start: 2019-11-03 — End: 2019-11-03
  Administered 2019-11-03 (×3): 50 ug via INTRAVENOUS
  Administered 2019-11-03: 25 ug via INTRAVENOUS
  Administered 2019-11-03 (×2): 50 ug via INTRAVENOUS
  Administered 2019-11-03: 25 ug via INTRAVENOUS

## 2019-11-03 MED ORDER — CLINDAMYCIN PHOSPHATE 900 MG/50ML IV SOLN
900.0000 mg | Freq: Once | INTRAVENOUS | Status: AC
  Administered 2019-11-03: 900 mg via INTRAVENOUS

## 2019-11-03 MED ORDER — KETOROLAC TROMETHAMINE 30 MG/ML IJ SOLN
30.0000 mg | Freq: Four times a day (QID) | INTRAMUSCULAR | Status: DC
  Administered 2019-11-03 – 2019-11-04 (×4): 30 mg via INTRAVENOUS

## 2019-11-03 MED ORDER — GABAPENTIN 300 MG PO CAPS
300.0000 mg | ORAL_CAPSULE | ORAL | Status: AC
  Administered 2019-11-03: 300 mg via ORAL

## 2019-11-03 MED ORDER — SIMETHICONE 80 MG PO CHEW: 80.0000 mg | CHEWABLE_TABLET | Freq: Four times a day (QID) | ORAL | Status: DC | PRN

## 2019-11-03 MED ORDER — OXYCODONE-ACETAMINOPHEN 5-325 MG PO TABS
1.0000 | ORAL_TABLET | ORAL | Status: DC | PRN
  Administered 2019-11-03 – 2019-11-04 (×4): 2 via ORAL

## 2019-11-03 MED ORDER — DIPHENHYDRAMINE HCL 12.5 MG/5ML PO ELIX: 12.5000 mg | ORAL_SOLUTION | Freq: Four times a day (QID) | ORAL | Status: DC | PRN

## 2019-11-03 MED ORDER — VASOPRESSIN 20 UNIT/ML IV SOLN
INTRAVENOUS | Status: DC | PRN
  Administered 2019-11-03: 20 mL via INTRAMUSCULAR

## 2019-11-03 MED ORDER — ALBUMIN HUMAN 5 % IV SOLN
INTRAVENOUS | Status: AC
  Filled 2019-11-03: qty 500

## 2019-11-03 MED ORDER — MIDAZOLAM HCL 2 MG/2ML IJ SOLN
INTRAMUSCULAR | Status: AC
  Filled 2019-11-03: qty 2

## 2019-11-03 MED ORDER — SODIUM CHLORIDE 0.9 % IR SOLN
Status: DC | PRN
  Administered 2019-11-03: 1000 mL via INTRAVESICAL

## 2019-11-03 MED ORDER — IBUPROFEN 800 MG PO TABS: 800.0000 mg | ORAL_TABLET | Freq: Three times a day (TID) | ORAL | Status: DC | PRN

## 2019-11-03 MED ORDER — SODIUM CHLORIDE 0.9% FLUSH: 9.0000 mL | INTRAVENOUS | Status: DC | PRN

## 2019-11-03 MED ORDER — POVIDONE-IODINE 10 % EX SWAB: 2.0000 "application " | Freq: Once | CUTANEOUS | Status: DC

## 2019-11-03 MED ORDER — ONDANSETRON HCL 4 MG/2ML IJ SOLN
INTRAMUSCULAR | Status: AC
  Filled 2019-11-03: qty 2

## 2019-11-03 MED ORDER — ACETAMINOPHEN 500 MG PO TABS
ORAL_TABLET | ORAL | Status: AC
  Filled 2019-11-03: qty 2

## 2019-11-03 MED ORDER — FLUORESCEIN SODIUM 10 % IV SOLN
INTRAVENOUS | Status: AC
  Filled 2019-11-03: qty 5

## 2019-11-03 MED ORDER — KETOROLAC TROMETHAMINE 30 MG/ML IJ SOLN: 30.0000 mg | Freq: Once | INTRAMUSCULAR | Status: AC | PRN

## 2019-11-03 MED ORDER — HYDROMORPHONE HCL 1 MG/ML IJ SOLN
0.2500 mg | INTRAMUSCULAR | Status: DC | PRN
  Administered 2019-11-03 (×4): 0.5 mg via INTRAVENOUS

## 2019-11-03 MED ORDER — ONDANSETRON HCL 4 MG PO TABS: 4.0000 mg | ORAL_TABLET | Freq: Four times a day (QID) | ORAL | Status: DC | PRN

## 2019-11-03 SURGICAL SUPPLY — 65 items
ADH SKN CLS APL DERMABOND .7 (GAUZE/BANDAGES/DRESSINGS) ×2
APL SKNCLS STERI-STRIP NONHPOA (GAUZE/BANDAGES/DRESSINGS) ×2
APL SRG 38 LTWT LNG FL B (MISCELLANEOUS)
APPLICATOR ARISTA FLEXITIP XL (MISCELLANEOUS) IMPLANT
BENZOIN TINCTURE PRP APPL 2/3 (GAUZE/BANDAGES/DRESSINGS) ×3 IMPLANT
BLADE SURG 10 STRL SS (BLADE) ×3 IMPLANT
CABLE HIGH FREQUENCY MONO STRZ (ELECTRODE) IMPLANT
CLSR STERI-STRIP ANTIMIC 1/2X4 (GAUZE/BANDAGES/DRESSINGS) ×3 IMPLANT
CNTNR URN SCR LID CUP LEK RST (MISCELLANEOUS) ×2 IMPLANT
CONT SPEC 4OZ STRL OR WHT (MISCELLANEOUS) ×3
COVER BACK TABLE 60X90IN (DRAPES) ×3 IMPLANT
COVER MAYO STAND STRL (DRAPES) ×6 IMPLANT
COVER SURGICAL LIGHT HANDLE (MISCELLANEOUS) ×3 IMPLANT
COVER WAND RF STERILE (DRAPES) ×3 IMPLANT
DECANTER SPIKE VIAL GLASS SM (MISCELLANEOUS) IMPLANT
DERMABOND ADVANCED (GAUZE/BANDAGES/DRESSINGS) ×1
DERMABOND ADVANCED .7 DNX12 (GAUZE/BANDAGES/DRESSINGS) ×2 IMPLANT
DRAPE WARM FLUID 44X44 (DRAPES) ×3 IMPLANT
DRSG COVADERM PLUS 2X2 (GAUZE/BANDAGES/DRESSINGS) IMPLANT
DRSG OPSITE POSTOP 3X4 (GAUZE/BANDAGES/DRESSINGS) IMPLANT
DRSG OPSITE POSTOP 4X10 (GAUZE/BANDAGES/DRESSINGS) ×3 IMPLANT
DURAPREP 26ML APPLICATOR (WOUND CARE) ×3 IMPLANT
ELECT REM PT RETURN 9FT ADLT (ELECTROSURGICAL)
ELECTRODE REM PT RTRN 9FT ADLT (ELECTROSURGICAL) IMPLANT
GAUZE 4X4 16PLY RFD (DISPOSABLE) ×3 IMPLANT
GLOVE BIO SURGEON STRL SZ 6.5 (GLOVE) ×9 IMPLANT
HEMOSTAT ARISTA ABSORB 3G PWDR (HEMOSTASIS) ×3 IMPLANT
HIBICLENS CHG 4% 4OZ (MISCELLANEOUS) ×3 IMPLANT
KIT TURNOVER CYSTO (KITS) ×3 IMPLANT
NEEDLE INSUFFLATION 120MM (ENDOMECHANICALS) ×3 IMPLANT
NS IRRIG 1000ML POUR BTL (IV SOLUTION) ×3 IMPLANT
PACK LAVH (CUSTOM PROCEDURE TRAY) ×3 IMPLANT
PACK TRENDGUARD 450 HYBRID PRO (MISCELLANEOUS) IMPLANT
PAD OB MATERNITY 4.3X12.25 (PERSONAL CARE ITEMS) ×3 IMPLANT
PENCIL SMOKE EVACUATOR (MISCELLANEOUS) ×3 IMPLANT
PROTECTOR NERVE ULNAR (MISCELLANEOUS) ×6 IMPLANT
RETRACTOR WND ALEXIS 25 LRG (MISCELLANEOUS) ×2 IMPLANT
RTRCTR WOUND ALEXIS 25CM LRG (MISCELLANEOUS) ×3
SET IRRIG Y TYPE TUR BLADDER L (SET/KITS/TRAYS/PACK) ×3 IMPLANT
SET SUCTION IRRIG HYDROSURG (IRRIGATION / IRRIGATOR) IMPLANT
SET TUBE SMOKE EVAC HIGH FLOW (TUBING) ×3 IMPLANT
SHEARS HARMONIC ACE PLUS 36CM (ENDOMECHANICALS) ×3 IMPLANT
SPONGE LAP 18X18 RF (DISPOSABLE) ×6 IMPLANT
STRIP CLOSURE SKIN 1/2X4 (GAUZE/BANDAGES/DRESSINGS) ×3 IMPLANT
SUT PLAIN 2 0 XLH (SUTURE) ×3 IMPLANT
SUT VIC AB 0 CT1 27 (SUTURE) ×6
SUT VIC AB 0 CT1 27XBRD ANBCTR (SUTURE) ×4 IMPLANT
SUT VIC AB 1 CT1 18XBRD ANBCTR (SUTURE) ×6 IMPLANT
SUT VIC AB 1 CT1 8-18 (SUTURE) ×9
SUT VIC AB 2-0 CT1 (SUTURE) ×3 IMPLANT
SUT VIC AB 2-0 SH 27 (SUTURE) ×3
SUT VIC AB 2-0 SH 27XBRD (SUTURE) ×2 IMPLANT
SUT VIC AB 3-0 SH 27 (SUTURE) ×6
SUT VIC AB 3-0 SH 27X BRD (SUTURE) ×4 IMPLANT
SUT VIC AB 4-0 KS 27 (SUTURE) ×6 IMPLANT
SUT VIC AB 4-0 PS2 27 (SUTURE) ×3 IMPLANT
SUT VICRYL 0 TIES 12 18 (SUTURE) IMPLANT
SUT VICRYL 0 UR6 27IN ABS (SUTURE) IMPLANT
TOWEL OR 17X26 10 PK STRL BLUE (TOWEL DISPOSABLE) ×6 IMPLANT
TRAY FOLEY W/BAG SLVR 14FR LF (SET/KITS/TRAYS/PACK) ×3 IMPLANT
TRENDGUARD 450 HYBRID PRO PACK (MISCELLANEOUS)
TROCAR BLADELESS OPT 5 100 (ENDOMECHANICALS) ×9 IMPLANT
TUBE CONNECTING 12X1/4 (SUCTIONS) ×3 IMPLANT
WARMER LAPAROSCOPE (MISCELLANEOUS) ×3 IMPLANT
YANKAUER SUCT BULB TIP NO VENT (SUCTIONS) ×3 IMPLANT

## 2019-11-03 NOTE — Interval H&P Note (Signed)
History and Physical Interval Note:  11/03/2019 7:40 AM  Colleen Ruiz  has presented today for surgery, with the diagnosis of Abnormal uterine bleeding.  The various methods of treatment have been discussed with the patient and family. After consideration of risks, benefits and other options for treatment, the patient has consented to  Procedure(s) with comments: LAPAROSCOPIC ASSISTED VAGINAL HYSTERECTOMY WITH SALPINGECTOMY (Bilateral) CYSTOSCOPY (N/A) - Possible as a surgical intervention.  The patient's history has been reviewed, patient examined, no change in status, stable for surgery.  I have reviewed the patient's chart and labs.  Questions were answered to the patient's satisfaction.     Jawuan Robb Bovard-Stuckert

## 2019-11-03 NOTE — Op Note (Signed)
NAMEPETE, MERTEN MEDICAL RECORD [id number] ACCOUNT [id number] DATE OF BIRTH:[birth year] FACILITY: [facility] LOCATION: [facility] PHYSICIAN:Michale Emmerich BOVARD-STUCKERT, MD  OPERATIVE REPORT  DATE OF PROCEDURE:  11/03/2019  PREOPERATIVE DIAGNOSIS:  Abnormal uterine bleeding  with normal endometrial biopsy.  POSTOPERATIVE DIAGNOSIS:  Abnormal uterine bleeding  with normal endometrial biopsy.  PROCEDURE:  Attempted laparoscopic-assisted vaginal hysterectomy with bilateral salpingectomy, converted to abdominal hysterectomy with bilateral salpingectomy and cystoscopy.  SURGEON:  Sherian Rein, MD  ASSISTANT:  Ellison Hughs, MD   ANESTHESIA:  Local and general.  ESTIMATED BLOOD LOSS:  Approximately 1250 mL.  URINE OUTPUT:  500 mL clear urine at the end of the procedure.  INTRAVENOUS FLUIDS:  300 mL crystalloid and 500 mL albumin.  FINDINGS:  Bulbous, enlarged 8-10 week size uterus, likely adenomyosis with several subserosal fibroids.  Normal tubes, status post tubal ligation and normal ovaries bilaterally.  COMPLICATIONS:  Densely adhered bladder to the lower uterine segment.  PATHOLOGY:  Uterus, cervix and bilateral fallopian tubes.  DESCRIPTION OF PROCEDURE:  After informed consent was reviewed with the patient, including risks, benefits and alternatives of the surgical procedure, she was transferred to the operating room and placed on the table in the supine position.  After  general anesthesia was then induced and found to be adequate, she was placed in the [place] stirrups.  Her arms were tucked.  After an appropriate timeout was performed, using an open-sided speculum, a Hulka manipulator was placed into her uterus.  A  Foley catheter was also sterilely placed.  Gloves and gown were changed.  Attention was turned to the abdominal portion of the case.  Using a Veress needle, pneumoperitoneum was obtained with an opening pressure of 1 mmHg after passing the hanging drop  test.   The port was placed under direct visualization.  Five mm ports were placed at the umbilicus.  Right and left accessory ports were placed under direct visualization.  A brief pelvic survey revealed as previously mentioned, an 8-10 week size,  bulbous, enlarged, boggy appearing uterus, likely adenomyosis with several subserosal fibroids,   Normal tubal segments after a previous tubal ligation, normal ovaries. The tubes were excised using Harmonic scalpel from both the right and left.   Attention was then performed to manipulating the uterus to be able to take down the round ligament and the cardinal ligament and the bladder flap was started using the Harmonic scalpel.  This was performed bilaterally.  Using a heavy weighted speculum  and a Sims retractor, her cervix was easily visualized, circumscribed with Bovie cautery after vasopressin was instilled, entered posteriorly.  A banana speculum was placed.  Attempt was made to enter anteriorly.  This was unsuccessful.  Bilateral  uterosacral ligaments were ligated and held in a stepwise fashion to the level of the uterine arteries.  We were unable to enter anteriorly and did not feel like typical peritoneal tissue with increasing blood loss and difficulty with entering  anteriorly.  The decision was made to convert to an open procedure.  At the level of her previous incision, a Pfannenstiel skin incision was made.  The fascia was nicked in the midline and the incision was extended laterally.  The rectus muscles were  dissected off bluntly and the midline was entered.  The peritoneum was entered sharply.  Alexis skin retractor was placed carefully, making sure that no bowel was entrapped.  The bowels were packed away with 3 moist laparotomy sponges.  The uterus was  grasped and pulled to the incision.  It was noted that the bladder was densely adhered.  The ovaries were also noted to have minimal bleeding.  They remained hemostatic.  The uterus was excised from  the bladder.  The vaginal incision was closed with 2-0  Vicryl in a running locked fashion.  A cystoscopy was performed revealing bilateral patent ureters after fluorescein was given and there was no extravasation into the abdominal cavity.  Arista was placed at the level of the bladder and vaginal cuff, as  well as over the ovaries to ensure hemostasis.  Copious irrigation was performed.  The plan is for the patient to receive another dose of antibiotics due to blood loss.  The peritoneum was reapproximated with 2-0 Vicryl in a running fashion.  Subfascial  planes were inspected and found to be hemostatic.  The fascia was closed from either aspect, overlapping in the midline.  Subcuticular adipose layer was made hemostatic with Bovie cautery.  The dead space was closed with plain gut suture.  The skin was  closed with 4-0 on a Keith needle.  Benzoin and Steri-Strips were applied.  The 3 trocar sites were also closed with 4-0 Vicryl and Dermabond was applied.  The patient tolerated the procedure well.  Sponge, lap and needle counts were correct x2 per the  operating staff at the end of the procedure.  VN/NUANCE  D:11/03/2019 T:11/03/2019 JOB:[id number]

## 2019-11-03 NOTE — Anesthesia Postprocedure Evaluation (Signed)
Anesthesia Post Note  Patient: DARIANNA AMY  Procedure(s) Performed: LAPAROSCOPIC ASSISTED ATTEMPTED  VAGINAL HYSTERECTOMY WITH SALPINGECTOMY, CONVERTED TO ABDOMINAL HYSTERECTOMY (Bilateral Abdomen) CYSTOSCOPY (N/A )     Patient location during evaluation: PACU Anesthesia Type: General Level of consciousness: awake and sedated Pain management: pain level controlled Vital Signs Assessment: post-procedure vital signs reviewed and stable Respiratory status: spontaneous breathing Cardiovascular status: stable Postop Assessment: no apparent nausea or vomiting Anesthetic complications: no   No complications documented.  Last Vitals:  Vitals:   11/03/19 1245 11/03/19 1307  BP: 109/68 (!) 103/55  Pulse: 64 71  Resp: (!) 5   Temp:  (!) 36.3 C  SpO2: 100% 96%    Last Pain:  Vitals:   11/03/19 1307  TempSrc: Oral  PainSc:    Pain Goal: Patients Stated Pain Goal: 5 (11/03/19 9563)                 Caren Macadam

## 2019-11-03 NOTE — Anesthesia Procedure Notes (Signed)
Procedure Name: Intubation Date/Time: 11/03/2019 8:11 AM Performed by: Justice Rocher, CRNA Pre-anesthesia Checklist: Patient identified, Emergency Drugs available, Suction available, Patient being monitored and Timeout performed Patient Re-evaluated:Patient Re-evaluated prior to induction Oxygen Delivery Method: Circle system utilized Preoxygenation: Pre-oxygenation with 100% oxygen Induction Type: IV induction Ventilation: Mask ventilation without difficulty Laryngoscope Size: Mac and 3 Grade View: Grade II Tube type: Oral Tube size: 7.0 mm Number of attempts: 1 Airway Equipment and Method: Stylet and Oral airway Placement Confirmation: ETT inserted through vocal cords under direct vision,  positive ETCO2,  breath sounds checked- equal and bilateral and CO2 detector Secured at: 23 cm Tube secured with: Tape Dental Injury: Teeth and Oropharynx as per pre-operative assessment

## 2019-11-03 NOTE — Brief Op Note (Signed)
11/03/2019  11:29 AM  PATIENT:  Colleen Ruiz  54 y.o. female  PRE-OPERATIVE DIAGNOSIS:  Abnormal uterine bleeding  POST-OPERATIVE DIAGNOSIS:  Abnormal uterine bleeding  PROCEDURE:  Procedure(s) with comments: LAPAROSCOPIC ASSISTED ATTEMPTED  VAGINAL HYSTERECTOMY WITH SALPINGECTOMY, CONVERTED TO ABDOMINAL HYSTERECTOMY (Bilateral) CYSTOSCOPY (N/A) - Possible; B salpingectomy  SURGEON:  Surgeon(s) and Role:    * Bovard-Stuckert, Aleka Twitty, MD - Primary    * Shivaji, Valerie Roys, MD - Assisting  ANESTHESIA:   local and general  EBL:  1250 mL IVF 3000cc, 500cc albumin, 500cc clear urine  FINDINGS: bulbous, enlarged 8-10 week size uterus - likely adenomyosis, several subserosal, nl tubes, previous BTL, nl ovaries B  BLOOD ADMINISTERED:none  DRAINS: Urinary Catheter (Foley)   LOCAL MEDICATIONS USED:  MARCAINE    and OTHER vasopressin  SPECIMEN:  Source of Specimen:  uterus, cervix, B fallopian tubes  DISPOSITION OF SPECIMEN:  PATHOLOGY  COUNTS:  YES  TOURNIQUET:  * No tourniquets in log *  DICTATION: .Other Dictation: Dictation Number [id number]  PLAN OF CARE: Admit for overnight observation  PATIENT DISPOSITION:  PACU - hemodynamically stable.   Delay start of Pharmacological VTE agent (>24hrs) due to surgical blood loss or risk of bleeding: not applicable

## 2019-11-03 NOTE — Progress Notes (Signed)
Day of Surgery Procedure(s) (LRB): LAPAROSCOPIC ASSISTED ATTEMPTED  VAGINAL HYSTERECTOMY WITH SALPINGECTOMY, CONVERTED TO ABDOMINAL HYSTERECTOMY (Bilateral) CYSTOSCOPY (N/A)  Subjective: Patient reports incisional pain and tolerating PO.    Objective: I have reviewed patient's vital signs, intake and output, medications and labs.  General: alert, cooperative and no distress Resp: clear to auscultation bilaterally Cardio: regular rate and rhythm GI: soft, non-tender; bowel sounds normal; no masses,  no organomegaly Extremities: extremities normal, atraumatic, no cyanosis or edema Inc: C/D/I  Assessment: s/p Procedure(s) with comments: LAPAROSCOPIC ASSISTED ATTEMPTED  VAGINAL HYSTERECTOMY WITH SALPINGECTOMY, CONVERTED TO ABDOMINAL HYSTERECTOMY (Bilateral) CYSTOSCOPY (N/A) - Possible: stable and progressing well  Plan: Advance diet Encourage ambulation Advance to PO medication Discontinue IV fluids   Monitor progress, possible d/c to home tomorrow - if ambulating, voiding, tolerating po, pain controlled w po pain meds.  Will reassess in AM   LOS: 0 days    Colleen Ruiz 11/03/2019, 7:18 PM

## 2019-11-03 NOTE — Anesthesia Preprocedure Evaluation (Signed)
Anesthesia Evaluation  Patient identified by MRN, date of birth, ID band Patient awake    Reviewed: Allergy & Precautions, NPO status , Patient's Chart, lab work & pertinent test results  Airway Mallampati: I       Dental no notable dental hx.    Pulmonary neg pulmonary ROS,    Pulmonary exam normal        Cardiovascular negative cardio ROS Normal cardiovascular exam     Neuro/Psych  Headaches, PSYCHIATRIC DISORDERS Anxiety    GI/Hepatic negative GI ROS, Neg liver ROS,   Endo/Other  negative endocrine ROS  Renal/GU negative Renal ROS  negative genitourinary   Musculoskeletal negative musculoskeletal ROS (+)   Abdominal Normal abdominal exam  (+)   Peds negative pediatric ROS (+)  Hematology   Anesthesia Other Findings   Reproductive/Obstetrics negative OB ROS                             Anesthesia Physical Anesthesia Plan  ASA: II  Anesthesia Plan: General   Post-op Pain Management:    Induction: Intravenous  PONV Risk Score and Plan: 4 or greater and Ondansetron, Dexamethasone and Midazolam  Airway Management Planned: Oral ETT  Additional Equipment: None  Intra-op Plan:   Post-operative Plan: Extubation in OR  Informed Consent: I have reviewed the patients History and Physical, chart, labs and discussed the procedure including the risks, benefits and alternatives for the proposed anesthesia with the patient or authorized representative who has indicated his/her understanding and acceptance.     Dental advisory given  Plan Discussed with: CRNA  Anesthesia Plan Comments:         Anesthesia Quick Evaluation

## 2019-11-03 NOTE — Transfer of Care (Signed)
Immediate Anesthesia Transfer of Care Note  Patient: Colleen Ruiz  Procedure(s) Performed: Procedure(s) (LRB): LAPAROSCOPIC ASSISTED ATTEMPTED  VAGINAL HYSTERECTOMY WITH SALPINGECTOMY, CONVERTED TO ABDOMINAL HYSTERECTOMY (Bilateral) CYSTOSCOPY (N/A)  Patient Location: PACU  Anesthesia Type: General  Level of Consciousness: awake, sedated, patient cooperative and responds to stimulation  Airway & Oxygen Therapy: Patient Spontanous Breathing and Patient connected to [place] 02 and soft FM   Post-op Assessment: Report given to PACU RN, Post -op Vital signs reviewed and stable and Patient moving all extremities  Post vital signs: Reviewed and stable  Complications: No apparent anesthesia complications

## 2019-11-04 DIAGNOSIS — N939 Abnormal uterine and vaginal bleeding, unspecified: Secondary | ICD-10-CM | POA: Diagnosis not present

## 2019-11-04 LAB — BASIC METABOLIC PANEL
Anion gap: 5 (ref 5–15)
BUN: 10 mg/dL (ref 6–20)
CO2: 25 mmol/L (ref 22–32)
Calcium: 7.5 mg/dL — ABNORMAL LOW (ref 8.9–10.3)
Chloride: 105 mmol/L (ref 98–111)
Creatinine, Ser: 0.82 mg/dL (ref 0.44–1.00)
GFR calc Af Amer: >60 mL/min (ref >60)
GFR calc non Af Amer: >60 mL/min (ref >60)
Glucose, Bld: 120 mg/dL — ABNORMAL HIGH (ref 70–99)
Potassium: 4.2 mmol/L (ref 3.5–5.1)
Sodium: 135 mmol/L (ref 135–145)

## 2019-11-04 LAB — CBC
HCT: 23.9 % — ABNORMAL LOW (ref 36.0–46.0)
Hemoglobin: 8 g/dL — ABNORMAL LOW (ref 12.0–15.0)
MCH: 29.3 pg (ref 26.0–34.0)
MCHC: 33.5 g/dL (ref 30.0–36.0)
MCV: 87.5 fL (ref 80.0–100.0)
Platelets: 183 10*3/uL (ref 150–400)
RBC: 2.73 MIL/uL — ABNORMAL LOW (ref 3.87–5.11)
RDW: 13.1 % (ref 11.5–15.5)
WBC: 9.8 10*3/uL (ref 4.0–10.5)
nRBC: 0 % (ref 0.0–0.2)

## 2019-11-04 MED ORDER — IBUPROFEN 800 MG PO TABS: 800.0000 mg | ORAL_TABLET | Freq: Three times a day (TID) | ORAL | 1 refills | Status: DC | PRN

## 2019-11-04 MED ORDER — OXYCODONE-ACETAMINOPHEN 5-325 MG PO TABS
1.0000 | ORAL_TABLET | Freq: Four times a day (QID) | ORAL | 0 refills | Status: DC | PRN
Start: 2019-11-04 — End: 2020-03-07

## 2019-11-04 MED ORDER — FERROUS SULFATE 325 (65 FE) MG PO TABS: 325.0000 mg | ORAL_TABLET | Freq: Every day | ORAL | 1 refills | Status: DC

## 2019-11-04 MED ORDER — OXYCODONE-ACETAMINOPHEN 5-325 MG PO TABS
ORAL_TABLET | ORAL | Status: AC
  Filled 2019-11-04: qty 2

## 2019-11-04 MED ORDER — KETOROLAC TROMETHAMINE 30 MG/ML IJ SOLN
INTRAMUSCULAR | Status: AC
  Filled 2019-11-04: qty 1

## 2019-11-04 NOTE — Discharge Instructions (Signed)
Abdominal Hysterectomy, Care After This sheet gives you information about how to care for yourself after your procedure. Your doctor may also give you more specific instructions. If you have problems or questions, contact your doctor. Follow these instructions at home: Bathing  Do not take baths, swim, or use a hot tub until your doctor says it is okay. Ask your doctor if you can take showers. You may only be allowed to take sponge baths for bathing.  Keep the bandage (dressing) dry until your doctor says it can be taken off. Surgical cut (incision) care      Follow instructions from your doctor about how to take care of your cut from surgery. Make sure you: ? Wash your hands with soap and water before you change your bandage (dressing). If you cannot use soap and water, use hand sanitizer. ? Change your bandage as told by your doctor. ? Leave stitches (sutures), skin glue, or skin tape (adhesive) strips in place. They may need to stay in place for 2 weeks or longer. If tape strips get loose and curl up, you may trim the loose edges. Do not remove tape strips completely unless your doctor says it is okay.  Check your surgical cut area every day for signs of infection. Check for: ? Redness, swelling, or pain. ? Fluid or blood. ? Warmth. ? Pus or a bad smell. Activity  Do gentle, daily exercise as told by your doctor. You may be told to take short walks every day and go farther each time.  Do not lift anything that is heavier than 10 lb (4.5 kg), or the limit that your doctor tells you, until he or she says that it is safe.  Do not drive or use heavy machinery while taking prescription pain medicine.  Do not drive for 24 hours if you were given a medicine to help you relax (sedative).  Follow your doctor's advice about exercise, driving, and general activities. Ask your doctor what activities are safe for you. Lifestyle   Do not douche, use tampons, or have sex for at least 6 weeks  or as told by your doctor.  Do not drink alcohol until your doctor says it is okay.  Drink enough fluid to keep your pee (urine) clear or pale yellow.  Try to have someone at home with you for the first 1-2 weeks to help.  Do not use any products that contain nicotine or tobacco, such as cigarettes and e-cigarettes. These can slow down healing. If you need help quitting, ask your doctor. General instructions  Take over-the-counter and prescription medicines only as told by your doctor.  Do not take aspirin or ibuprofen. These medicines can cause bleeding.  To prevent or treat constipation while you are taking prescription pain medicine, your doctor may suggest that you: ? Drink enough fluid to keep your urine clear or pale yellow. ? Take over-the-counter or prescription medicines. ? Eat foods that are high in fiber, such as:  Fresh fruits and vegetables.  Whole grains.  Beans. ? Limit foods that are high in fat and processed sugars, such as fried and sweet foods.  Keep all follow-up visits as told by your doctor. This is important. Contact a doctor if:  You have chills or fever.  You have redness, swelling, or pain around your cut.  You have fluid or blood coming from your cut.  Your cut feels warm to the touch.  You have pus or a bad smell coming from your cut.    Your cut breaks open.  You feel dizzy or light-headed.  You have pain or bleeding when you pee.  You keep having watery poop (diarrhea).  You keep feeling sick to your stomach (nauseous) or keep throwing up (vomiting).  You have unusual fluid (discharge) coming from your vagina.  You have a rash.  You have a reaction to your medicine.  Your pain medicine does not help. Get help right away if:  You have a fever and your symptoms get worse all of a sudden.  You have very bad belly (abdominal) pain.  You are short of breath.  You pass out (faint).  You have pain, swelling, or redness of your  leg.  You bleed a lot from your vagina and notice clumps of blood (clots). Summary  Do not take baths, swim, or use a hot tub until your doctor says it is okay. Ask your doctor if you can take showers. You may only be allowed to take sponge baths for bathing.  Follow your doctor's advice about exercise, driving, and general activities. Ask your doctor what activities are safe for you.  Do not lift anything that is heavier than 10 lb (4.5 kg), or the limit that your doctor tells you, until he or she says that it is safe.  Try to have someone at home with you for the first 1-2 weeks to help. This information is not intended to replace advice given to you by your health care provider. Make sure you discuss any questions you have with your health care provider. Document Revised: 04/01/2018 Document Reviewed: 01/16/2016 Elsevier Patient Education  2020 [employer]  

## 2019-11-04 NOTE — Discharge Summary (Signed)
Physician Discharge Summary  Patient ID: Colleen Ruiz MRN: [id number] DOB/AGE: 1965/10/24 54 y.o.  Admit date: 11/03/2019 Discharge date: 11/04/2019  Admission Diagnoses: AUM, perimenopausal bleeding  Discharge Diagnoses:  Principal Problem:   S/P total abdominal hysterectomy Active Problems:   S/P TAH (total abdominal hysterectomy)   Discharged Condition: good  Hospital Course: admitted for surgery - in separate not.  Post op care routine.  Ambulating, voiding, tolerating po and pain controlled.  D/c to home POD#1  Consults: None  Significant Diagnostic Studies: labs: CMC, CMP  Treatments: IV hydration, antibiotics: gentamycin and clinda, analgesia: Dilaudid and percocet and surgery: attempted LAVH/convert to TAH/BS and cysto  Discharge Exam: Blood pressure (!) 103/58, pulse (!) 57, temperature 97.8 F (36.6 C), resp. rate (!) 28, height 5' 9.5" (1.765 m), weight 87.1 kg, SpO2 98 %. General appearance: alert and no distress Resp: clear to auscultation bilaterally Cardio: regular rate and rhythm GI: soft, non-tender; bowel sounds normal; no masses,  no organomegaly Extremities: extremities normal, atraumatic, no cyanosis or edema Incision/Wound: C/D/I  Disposition: Discharge disposition: 01-Home or Self Care       Discharge Instructions     Remove dressing in 72 hours   Complete by: As directed    Call MD for:  persistant nausea and vomiting   Complete by: As directed    Call MD for:  severe uncontrolled pain   Complete by: As directed    Diet - low sodium heart healthy   Complete by: As directed    Discharge instructions   Complete by: As directed    Call [phone] with questions or problems   Driving Restrictions   Complete by: As directed    While taking strong pain medicine   Increase activity slowly   Complete by: As directed    Lifting restrictions   Complete by: As directed    No greater than 10-15lbs for 6 weeks   May shower / Bathe   Complete  by: As directed    May walk up steps   Complete by: As directed    Sexual Activity Restrictions   Complete by: As directed    Pelvic rest - no douching, tampons or sex for 6 weeks     Allergies as of 11/04/2019      Reactions   Sulfonamide Derivatives Hives   Penicillins Rash      Medication List    STOP taking these medications   norethindrone 5 MG tablet Commonly known as: AYGESTIN     TAKE these medications   acetaminophen 650 MG CR tablet Commonly known as: TYLENOL Take 650 mg by mouth daily as needed for pain.   estradiol 2 MG vaginal ring Commonly known as: ESTRING Place 2 mg vaginally every 3 (three) months. follow package directions   ferrous sulfate 325 (65 FE) MG tablet Take 1 tablet (325 mg total) by mouth daily with breakfast.   hydrocortisone cream 1 % Apply 1 application topically daily as needed for itching.   ibuprofen 800 MG tablet Commonly known as: ADVIL Take 1 tablet (800 mg total) by mouth every 8 (eight) hours as needed for moderate pain. Notes to patient: Next dose is due at 4 pm as needed for pain   LORazepam 2 MG tablet Commonly known as: ATIVAN 1/2 -1 tab po bid prn anxiety What changed:   how much to take  how to take this  when to take this  reasons to take this  additional instructions   oxyCODONE-acetaminophen 5-325  MG tablet Commonly known as: PERCOCET/ROXICET Take 1-2 tablets by mouth every 6 (six) hours as needed for severe pain (moderate to severe pain (when tolerating fluids)). Notes to patient: Next dose is due at 2 pm as needed for pain   zolpidem 10 MG tablet Commonly known as: AMBIEN 1 tab po qhs prn insomnia What changed:   how much to take  how to take this  when to take this  reasons to take this  additional instructions       Follow-up Information    Bovard-Stuckert, Tylan Briguglio, MD. Schedule an appointment as soon as possible for a visit in 2 day(s).   Specialty: Obstetrics and Gynecology Why: for  incision check and 6 weeks for postop check Contact information: [address] [place] [address] [phone]               Signed: Sherian Rein 11/04/2019, 8:21 AM

## 2019-11-04 NOTE — Progress Notes (Signed)
1 Day Post-Op Procedure(s) (LRB): LAPAROSCOPIC ASSISTED ATTEMPTED  VAGINAL HYSTERECTOMY WITH SALPINGECTOMY, CONVERTED TO ABDOMINAL HYSTERECTOMY (Bilateral) CYSTOSCOPY (N/A)  Subjective: Patient reports incisional pain, tolerating PO and no problems voiding.    Objective: I have reviewed patient's vital signs, intake and output, medications and labs.  General: alert and no distress Resp: clear to auscultation bilaterally Cardio: regular rate and rhythm GI: soft, non-tender; bowel sounds normal; no masses,  no organomegaly and incision: clean, dry and intact Extremities: extremities normal, atraumatic, no cyanosis or edema  Assessment: s/p Procedure(s) with comments: LAPAROSCOPIC ASSISTED ATTEMPTED  VAGINAL HYSTERECTOMY WITH SALPINGECTOMY, CONVERTED TO ABDOMINAL HYSTERECTOMY (Bilateral) CYSTOSCOPY (N/A) - Possible: stable and progressing well  Plan: Encourage ambulation  Ready for d/c today, await BMP results.  Given discharge precautions.  Will d/c w percocet, motrin, iron.    LOS: 0 days    Hamish Banks Bovard-Stuckert 11/04/2019, 6:54 AM

## 2019-11-07 ENCOUNTER — Encounter (HOSPITAL_BASED_OUTPATIENT_CLINIC_OR_DEPARTMENT_OTHER): Payer: Self-pay | Admitting: Obstetrics and Gynecology

## 2019-11-07 LAB — SURGICAL PATHOLOGY

## 2019-12-26 ENCOUNTER — Encounter: Payer: Self-pay | Admitting: Family Medicine

## 2020-02-23 ENCOUNTER — Other Ambulatory Visit: Payer: Self-pay | Admitting: Family Medicine

## 2020-02-23 NOTE — Telephone Encounter (Signed)
Ambien #[id number] rx'd but needs office f/u for insomnia and this med before any further rf's.-thx

## 2020-02-23 NOTE — Telephone Encounter (Signed)
Requesting: zolpidem Contract:12/02/18 UDS: n/a Last Visit: 09/06/19, acute and 08/17/19, CPE Next Visit: advised to f/u 02/2020 Last Refill: 08/17/19(30,3)  Please Advise. Medication pending

## 2020-03-07 ENCOUNTER — Other Ambulatory Visit: Payer: Self-pay

## 2020-03-07 ENCOUNTER — Ambulatory Visit (INDEPENDENT_AMBULATORY_CARE_PROVIDER_SITE_OTHER): Payer: No Typology Code available for payment source | Admitting: Family Medicine

## 2020-03-07 ENCOUNTER — Encounter: Payer: Self-pay | Admitting: Family Medicine

## 2020-03-07 VITALS — BP 111/77 | HR 71 | Temp 97.8°F | Resp 16 | Ht 69.0 in | Wt 186.8 lb

## 2020-03-07 DIAGNOSIS — F5101 Primary insomnia: Secondary | ICD-10-CM

## 2020-03-07 DIAGNOSIS — Z79899 Other long term (current) drug therapy: Secondary | ICD-10-CM | POA: Diagnosis not present

## 2020-03-07 NOTE — Progress Notes (Signed)
OFFICE VISIT  03/07/2020  CC:  Chief Complaint  Patient presents with  . Follow-up    HPI:    Patient is a 55 y.o. African-American female who presents for f/u anxiety plus anxiety-induced insomnia. Reports feeling well.  She got TAH and bilat salpingectomy (lap attempted->converted to open) 11/03/19. Hb 9.3 pre-op, down to 8.0 day after surgery. Pt states taking 325mg  FeSO4 qd, Hb monitoring done by GYN MD and pt states this has been good.  Insomnia: takes ambien 2-3 nights per week typically.   PMP AWARE reviewed today: most recent rx for ambien 10mg  was filled 02/23/20, # 30, rx by me. No red flags.   Past Medical History:  Diagnosis Date  . Abnormal uterine bleeding    10/2019 LAVH/BS converted to TAH. Path ->fibroids,adenomyosis,nl cervix and fallopian tubes.  . Anemia   . Anxiety   . History of hematuria   . History of sexually transmitted disease    trich, gonorrhea, lice  . Insomnia   . Menopausal symptom    flushing and vag dryness->HRT 2020 per gyn  . Migraine syndrome   . S/P total abdominal hysterectomy 11/03/2019  . Seasonal allergies   . Urinary tract bacterial infections   . Vitamin D deficiency    Vit D into normal range only with twice weekly 50K U vit D tab.    Past Surgical History:  Procedure Laterality Date  . CESAREAN SECTION  1990  . CYSTOSCOPY N/A 11/03/2019   Procedure: CYSTOSCOPY;  Surgeon: 11/05/2019, MD;  Location: [facility];  Service: Gynecology;  Laterality: N/A;  Possible  . EYE SURGERY     Lasik  . LAPAROSCOPIC VAGINAL HYSTERECTOMY WITH SALPINGECTOMY Bilateral 11/03/2019   Procedure: LAPAROSCOPIC ASSISTED ATTEMPTED  VAGINAL HYSTERECTOMY WITH SALPINGECTOMY, CONVERTED TO ABDOMINAL HYSTERECTOMY;  Surgeon: [facility] MD;  Location: [facility];  Service: Gynecology;  Laterality: Bilateral;  . TUBAL LIGATION    . TYMPANOPLASTY  2010    Outpatient Medications Prior to Visit  Medication  Sig Dispense Refill  . estradiol (ESTRING) 2 MG vaginal ring Place 2 mg vaginally every 3 (three) months. follow package directions    . ferrous sulfate 325 (65 FE) MG tablet Take 1 tablet (325 mg total) by mouth daily with breakfast. 60 tablet 1  . zolpidem (AMBIEN) 10 MG tablet TAKE 1 TABLET BY MOUTH AT BEDTIME AS NEEDED FOR INSOMNIA 30 tablet 0  . acetaminophen (TYLENOL) 650 MG CR tablet Take 650 mg by mouth daily as needed for pain. (Patient not taking: Reported on 03/07/2020)    . hydrocortisone cream 1 % Apply 1 application topically daily as needed for itching. (Patient not taking: Reported on 03/07/2020)    . LORazepam (ATIVAN) 2 MG tablet 1/2 -1 tab po bid prn anxiety (Patient not taking: Reported on 03/07/2020) 30 tablet 3  . ibuprofen (ADVIL) 800 MG tablet Take 1 tablet (800 mg total) by mouth every 8 (eight) hours as needed for moderate pain. (Patient not taking: Reported on 03/07/2020) 45 tablet 1  . oxyCODONE-acetaminophen (PERCOCET/ROXICET) 5-325 MG tablet Take 1-2 tablets by mouth every 6 (six) hours as needed for severe pain (moderate to severe pain (when tolerating fluids)). (Patient not taking: Reported on 03/07/2020) 20 tablet 0   No facility-administered medications prior to visit.    Allergies  Allergen Reactions  . Sulfonamide Derivatives Hives  . Penicillins Rash    ROS As per HPI  PE: Vitals with BMI 03/07/2020 11/04/2019 11/04/2019  Height 5\' 9"  - -  Weight 186 lbs 13 oz - -  BMI 27.57 - -  Systolic 111 103 315  Diastolic 77 58 59  Pulse 71 57 60     Gen: Alert, well appearing.  Patient is oriented to person, place, time, and situation. AFFECT: pleasant, lucid thought and speech. CV: RRR, no m/r/g.   LUNGS: CTA bilat, nonlabored resps, good aeration in all lung fields. EXT: no clubbing or cyanosis.  no edema.    LABS:  Lab Results  Component Value Date   TSH 1.18 04/08/2016   Lab Results  Component Value Date   WBC 9.8 11/04/2019   HGB 8.0 (L)  11/04/2019   HCT 23.9 (L) 11/04/2019   MCV 87.5 11/04/2019   PLT 183 11/04/2019   Lab Results  Component Value Date   IRON 61 10/27/2016   TIBC 278 10/27/2016   FERRITIN 41 10/27/2016    Lab Results  Component Value Date   CREATININE 0.82 11/04/2019   BUN 10 11/04/2019   NA 135 11/04/2019   K 4.2 11/04/2019   CL 105 11/04/2019   CO2 25 11/04/2019   Lab Results  Component Value Date   ALT 17 10/26/2019   AST 16 10/26/2019   ALKPHOS 44 10/26/2019   BILITOT 0.6 10/26/2019   Lab Results  Component Value Date   CHOL 183 04/08/2016   Lab Results  Component Value Date   HDL 59.20 04/08/2016   Lab Results  Component Value Date   LDLCALC 112 (H) 04/08/2016   Lab Results  Component Value Date   TRIG 59.0 04/08/2016   Lab Results  Component Value Date   CHOLHDL 3 04/08/2016   Lab Results  Component Value Date   HGBA1C 5.4 08/15/2014   IMPRESSION AND PLAN:  Anxiety-induced insomnia. She does well with prn use of ambien, no adverse side effects. CSC updated today. No new rx for [name] was needed today.  Chronic iron def anemia: secondary to DUB->now is s/p TAH and bilat salpingectomy and has been on oral iron. Pt states her GYN has been monitoring her Hb and it has apparently returned to normal but she has been instructed to stay on iron tab until f/u with them in June.  An After Visit Summary was printed and given to the patient.  FOLLOW UP: Return in about 6 months (around 09/04/2020) for annual CPE (fasting).  Signed:  Santiago Bumpers, MD           03/07/2020

## 2020-03-16 ENCOUNTER — Encounter: Payer: Self-pay | Admitting: Family Medicine

## 2020-03-27 ENCOUNTER — Other Ambulatory Visit: Payer: Self-pay | Admitting: Family Medicine

## 2020-03-28 NOTE — Telephone Encounter (Signed)
Requesting: zolpidem Contract: 12/02/18 UDS: n/a Last Visit: 03/07/20 Next Visit: advised to f/u 6 mo. Last Refill:02/23/20(30,0)  Please Advise. Medication pending

## 2020-03-29 NOTE — Telephone Encounter (Signed)
Unable to leave message, VM is full. Rx sent.

## 2020-09-06 LAB — HM MAMMOGRAPHY

## 2020-09-25 ENCOUNTER — Other Ambulatory Visit: Payer: Self-pay | Admitting: Family Medicine

## 2020-09-26 NOTE — Telephone Encounter (Signed)
Will do 30d rx. Needs follow up.

## 2020-09-26 NOTE — Telephone Encounter (Signed)
Requesting: zolpidem Contract: 12/02/18 UDS: N/A Last Visit:03/07/20 Next Visit: advised to f/u July Last Refill:03/28/20(30,5)  Please review and advise. Med pending

## 2020-09-26 NOTE — Telephone Encounter (Signed)
Spoke with pt to advise follow up needed for further refills, appt scheduled 08/22 11am

## 2020-10-01 ENCOUNTER — Ambulatory Visit (INDEPENDENT_AMBULATORY_CARE_PROVIDER_SITE_OTHER): Payer: No Typology Code available for payment source | Admitting: Family Medicine

## 2020-10-01 ENCOUNTER — Encounter: Payer: Self-pay | Admitting: Family Medicine

## 2020-10-01 ENCOUNTER — Other Ambulatory Visit: Payer: Self-pay

## 2020-10-01 VITALS — BP 95/65 | HR 62 | Ht 69.0 in | Wt 198.6 lb

## 2020-10-01 DIAGNOSIS — F5104 Psychophysiologic insomnia: Secondary | ICD-10-CM | POA: Diagnosis not present

## 2020-10-01 DIAGNOSIS — D649 Anemia, unspecified: Secondary | ICD-10-CM

## 2020-10-01 DIAGNOSIS — D500 Iron deficiency anemia secondary to blood loss (chronic): Secondary | ICD-10-CM

## 2020-10-01 DIAGNOSIS — Z79899 Other long term (current) drug therapy: Secondary | ICD-10-CM

## 2020-10-01 DIAGNOSIS — Z0000 Encounter for general adult medical examination without abnormal findings: Secondary | ICD-10-CM | POA: Diagnosis not present

## 2020-10-01 LAB — CBC WITH DIFFERENTIAL/PLATELET
Basophils Absolute: 0 10*3/uL (ref 0.0–0.1)
Basophils Relative: 1 % (ref 0.0–3.0)
Eosinophils Absolute: 0.1 10*3/uL (ref 0.0–0.7)
Eosinophils Relative: 1.4 % (ref 0.0–5.0)
HCT: 35.3 % — ABNORMAL LOW (ref 36.0–46.0)
Hemoglobin: 11.6 g/dL — ABNORMAL LOW (ref 12.0–15.0)
Lymphocytes Relative: 35.7 % (ref 12.0–46.0)
Lymphs Abs: 1.5 10*3/uL (ref 0.7–4.0)
MCHC: 32.8 g/dL (ref 30.0–36.0)
MCV: 86.5 fl (ref 78.0–100.0)
Monocytes Absolute: 0.3 10*3/uL (ref 0.1–1.0)
Monocytes Relative: 6.2 % (ref 3.0–12.0)
Neutro Abs: 2.4 10*3/uL (ref 1.4–7.7)
Neutrophils Relative %: 55.7 % (ref 43.0–77.0)
Platelets: 257 10*3/uL (ref 150.0–400.0)
RBC: 4.08 Mil/uL (ref 3.87–5.11)
RDW: 13.6 % (ref 11.5–15.5)
WBC: 4.3 10*3/uL (ref 4.0–10.5)

## 2020-10-01 LAB — TSH: TSH: 0.79 u[IU]/mL (ref 0.35–5.50)

## 2020-10-01 LAB — COMPREHENSIVE METABOLIC PANEL
ALT: 14 U/L (ref 0–35)
AST: 14 U/L (ref 0–37)
Albumin: 4 g/dL (ref 3.5–5.2)
Alkaline Phosphatase: 80 U/L (ref 39–117)
BUN: 11 mg/dL (ref 6–23)
CO2: 27 mEq/L (ref 19–32)
Calcium: 8.9 mg/dL (ref 8.4–10.5)
Chloride: 103 mEq/L (ref 96–112)
Creatinine, Ser: 0.66 mg/dL (ref 0.40–1.20)
GFR: 99.16 mL/min (ref >60.00)
Glucose, Bld: 75 mg/dL (ref 70–99)
Potassium: 3.9 mEq/L (ref 3.5–5.1)
Sodium: 138 mEq/L (ref 135–145)
Total Bilirubin: 0.5 mg/dL (ref 0.2–1.2)
Total Protein: 7.3 g/dL (ref 6.0–8.3)

## 2020-10-01 LAB — LIPID PANEL
Cholesterol: 185 mg/dL (ref 0–200)
HDL: 63.2 mg/dL (ref >39.00)
LDL Cholesterol: 111 mg/dL — ABNORMAL HIGH (ref 0–99)
NonHDL: 122.13
Total CHOL/HDL Ratio: 3
Triglycerides: 58 mg/dL (ref 0.0–149.0)
VLDL: 11.6 mg/dL (ref 0.0–40.0)

## 2020-10-01 MED ORDER — ZOLPIDEM TARTRATE 10 MG PO TABS
ORAL_TABLET | ORAL | 5 refills | Status: DC
Start: 2020-10-01 — End: 2021-04-11

## 2020-10-01 NOTE — Progress Notes (Signed)
Office Note 10/01/2020  CC:  Chief Complaint  Patient presents with   Follow-up    RCI    HPI:  Colleen Ruiz is a 55 y.o. Black female who is here for annual health maintenance exam and f/u anxiety, anxiety-induced insomnia, and normocytic anemia. A/P as of last visit: "1) Insomnia : She does well with prn use of ambien, no adverse side effects. CSC updated today. No new rx for [name] was needed today.   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 Pt states her GYN has been monitoring her Hb and it has apparently returned to normal but she has been instructed to stay on iron tab until f/u with them in June."  INTERIM HX: She's doing well, just some aches and pains lately, some in L knee/thigh, some in R LB/glut. Says she thinks it is from posture/body position habits since working at home on cough.  Says she is going to get back to working at her desk, start stretching and daily walking again.  No joint swelling, no paresthesias.  Says she saw GYN couple months ago and everything must have been fine with them b/c pt did not hear anything back from them. Taking iron tab 2-3 days a week.  MVI qd.  Takes [place] as needed, ends up being about every 2-3 nights.  No adverse effects. PMP AWARE reviewed today: most recent rx for zolpidem was filled 08/12/20, # 30, rx by me. No red flags.   Past Medical History:  Diagnosis Date   Abnormal uterine bleeding    10/2019 LAVH/BS converted to TAH. Path ->fibroids,adenomyosis,nl cervix and fallopian tubes.   Anemia    Anxiety    History of hematuria    History of sexually transmitted disease    trich, gonorrhea, lice   Insomnia    Menopausal symptom    flushing and vag dryness->HRT 2020 per gyn   Migraine syndrome    S/P total abdominal hysterectomy 11/03/2019   Seasonal allergies    Urinary tract bacterial infections    Vitamin D deficiency    Vit D into normal  range only with twice weekly 50K U vit D tab.    Past Surgical History:  Procedure Laterality Date   CESAREAN SECTION  1990   COLONOSCOPY  02/2019   [facility]   CYSTOSCOPY N/A 11/03/2019   Procedure: CYSTOSCOPY;  Surgeon: Sherian Rein, MD;  Location: [facility];  Service: Gynecology;  Laterality: N/A;  Possible   EYE SURGERY     Lasik   LAPAROSCOPIC VAGINAL HYSTERECTOMY WITH SALPINGECTOMY Bilateral 11/03/2019   cervical stump remains. Procedure: LAPAROSCOPIC ASSISTED ATTEMPTED  VAGINAL HYSTERECTOMY WITH SALPINGECTOMY, CONVERTED TO ABDOMINAL HYSTERECTOMY;  Surgeon: Sherian Rein, MD;  Location: [facility];  Service: Gynecology;  Laterality: Bilateral;   TUBAL LIGATION     TYMPANOPLASTY  2010    Family History  Problem Relation Age of Onset   Hypertension Mother    Dementia Mother    Heart disease Father    Cancer Father    Breast cancer Paternal Aunt        8's     Social History   Socioeconomic History   Marital status: Married    Spouse name: Not on file   Number of children: 2   Years of education: Not on file   Highest education level: Not on file  Occupational History    Employer:  [employer]  Tobacco Use   Smoking status: Never   Smokeless tobacco: Never  Vaping Use   Vaping Use: Never used  Substance and Sexual Activity   Alcohol use: No   Drug use: No   Sexual activity: Yes    Birth control/protection: Surgical  Other Topics Concern   Not on file  Social History Narrative   Colleen Ruiz lives with her husband, works for [employer], and has two grown children. Her son serves in the [employer] and is currently serving in [place].  She will be going to [place] 04/2014 to visit him.  Her daughter studied psychology at [employer] and has returned for graduate studies.    No T/A/Ds.   Social Determinants of Health   Financial Resource Strain: Not on file  Food Insecurity: Not on file  Transportation  Needs: Not on file  Physical Activity: Not on file  Stress: Not on file  Social Connections: Not on file  Intimate Partner Violence: Not on file    Outpatient Medications Prior to Visit  Medication Sig Dispense Refill   estradiol (CLIMARA - DOSED IN MG/24 HR) 0.05 mg/24hr patch Place 0.05 mg onto the skin once a week.     estradiol (CLIMARA - DOSED IN MG/24 HR) 0.1 mg/24hr patch Place 0.1 mg onto the skin once a week.     ferrous sulfate 325 (65 FE) MG tablet Take 1 tablet (325 mg total) by mouth daily with breakfast. 60 tablet 1   zolpidem (AMBIEN) 10 MG tablet TAKE 1 TABLET BY MOUTH AT BEDTIME AS NEEDED FOR INSOMNIA 30 tablet 0   acetaminophen (TYLENOL) 650 MG CR tablet Take 650 mg by mouth daily as needed for pain. (Patient not taking: No sig reported)     hydrocortisone cream 1 % Apply 1 application topically daily as needed for itching. (Patient not taking: No sig reported)     estradiol (ESTRING) 2 MG vaginal ring Place 2 mg vaginally every 3 (three) months. follow package directions     LORazepam (ATIVAN) 2 MG tablet 1/2 -1 tab po bid prn anxiety (Patient not taking: No sig reported) 30 tablet 3   No facility-administered medications prior to visit.    Allergies  Allergen Reactions   Sulfonamide Derivatives Hives   Penicillins Rash    ROS Review of Systems  Constitutional:  Negative for appetite change, chills, fatigue and fever.  HENT:  Negative for congestion, dental problem, ear pain and sore throat.   Eyes:  Negative for discharge, redness and visual disturbance.  Respiratory:  Negative for cough, chest tightness, shortness of breath and wheezing.   Cardiovascular:  Negative for chest pain, palpitations and leg swelling.  Gastrointestinal:  Negative for abdominal pain, blood in stool, diarrhea, nausea and vomiting.  Genitourinary:  Negative for difficulty urinating, dysuria, flank pain, frequency, hematuria and urgency.  Musculoskeletal:  Negative for arthralgias, back  pain, joint swelling, myalgias and neck stiffness.  Skin:  Negative for pallor and rash.  Neurological:  Negative for dizziness, speech difficulty, weakness and headaches.  Hematological:  Negative for adenopathy. Does not bruise/bleed easily.  Psychiatric/Behavioral:  Negative for confusion and sleep disturbance. The patient is not nervous/anxious.    PE; Vitals with BMI 10/01/2020 03/07/2020 11/04/2019  Height 5\' 9"  5\' 9"  -  Weight 198 lbs 10 oz 186 lbs 13 oz -  BMI 29.31 27.57 -  Systolic 95 111 103  Diastolic 65 77 58  Pulse 62 71 57  Exam chaperoned by ,  CMA.  Gen: Alert, well appearing.  Patient is oriented to person, place, time, and situation. AFFECT: pleasant, lucid thought and speech. ENT: Ears: EACs clear, normal epithelium.  TMs with good light reflex and landmarks bilaterally.  Eyes: no injection, icteris, swelling, or exudate.  EOMI, PERRLA. Nose: no drainage or turbinate edema/swelling.  No injection or focal lesion.  Mouth: lips without lesion/swelling.  Oral mucosa pink and moist.  Dentition intact and without obvious caries or gingival swelling.  Oropharynx without erythema, exudate, or swelling.  Neck: supple/nontender.  No LAD, mass, or TM.  Carotid pulses 2+ bilaterally, without bruits. CV: RRR, no m/r/g.   LUNGS: CTA bilat, nonlabored resps, good aeration in all lung fields. ABD: soft, NT, ND, BS normal.  No hepatospenomegaly or mass.  No bruits. EXT: no clubbing, cyanosis, or edema.  Musculoskeletal: no joint swelling, erythema, warmth, or tenderness.  ROM of all joints intact. Skin - no sores or suspicious lesions or rashes or color changes  Pertinent labs:  Lab Results  Component Value Date   TSH 1.18 04/08/2016   Lab Results  Component Value Date   WBC 9.8 11/04/2019   HGB 8.0 (L) 11/04/2019   HCT 23.9 (L) 11/04/2019   MCV 87.5 11/04/2019   PLT 183 11/04/2019   Lab Results  Component Value Date   IRON 61 10/27/2016   TIBC 278  10/27/2016   FERRITIN 41 10/27/2016   Lab Results  Component Value Date   CREATININE 0.82 11/04/2019   BUN 10 11/04/2019   NA 135 11/04/2019   K 4.2 11/04/2019   CL 105 11/04/2019   CO2 25 11/04/2019   Lab Results  Component Value Date   ALT 17 10/26/2019   AST 16 10/26/2019   ALKPHOS 44 10/26/2019   BILITOT 0.6 10/26/2019   Lab Results  Component Value Date   CHOL 183 04/08/2016   Lab Results  Component Value Date   HDL 59.20 04/08/2016   Lab Results  Component Value Date   LDLCALC 112 (H) 04/08/2016   Lab Results  Component Value Date   TRIG 59.0 04/08/2016   Lab Results  Component Value Date   CHOLHDL 3 04/08/2016   Lab Results  Component Value Date   HGBA1C 5.4 08/15/2014   ASSESSMENT AND PLAN:   1) Anxiety and anxiety-induced insomnia, high risk med use: CSC UTD. UDS today.  Ambien 10mg , 1 qhs prn, #30, RF x 5.  2) Hx of IDA d/t DUB and subsequent hysterectomy last year. Has been on iron supplement since that time.   CBC and iron panel today.  3) Health maintenance exam: Reviewed age and gender appropriate health maintenance issues (prudent diet, regular exercise, health risks of tobacco and excessive alcohol, use of seatbelts, fire alarms in home, use of sunscreen).  Also reviewed age and gender appropriate health screening as well as vaccine recommendations. Vaccines: ALL UTD. Labs: fasting HP, iron panel. Cervical ca screening: per GYN MD. Breast ca screening: per GYN MD. Colon ca screening: per pt report, 2021 colonoscopy normal ([facility]): will obtain records.  An After Visit Summary was printed and given to the patient.  FOLLOW UP:  Return in about 6 months (around 04/03/2021) for f/u insomnia/med.  Signed:  04/05/2021, MD           10/01/2020

## 2020-10-01 NOTE — Patient Instructions (Signed)
Health Maintenance, Female Adopting a healthy lifestyle and getting preventive care are important in promoting health and wellness. Ask your health care provider about: The right schedule for you to have regular tests and exams. Things you can do on your own to prevent diseases and keep yourself healthy. What should I know about diet, weight, and exercise? Eat a healthy diet  Eat a diet that includes plenty of vegetables, fruits, low-fat dairy products, and lean protein. Do not eat a lot of foods that are high in solid fats, added sugars, or sodium.  Maintain a healthy weight Body mass index (BMI) is used to identify weight problems. It estimates body fat based on height and weight. Your health care provider can help determineyour BMI and help you achieve or maintain a healthy weight. Get regular exercise Get regular exercise. This is one of the most important things you can do for your health. Most adults should: Exercise for at least 150 minutes each week. The exercise should increase your heart rate and make you sweat (moderate-intensity exercise). Do strengthening exercises at least twice a week. This is in addition to the moderate-intensity exercise. Spend less time sitting. Even light physical activity can be beneficial. Watch cholesterol and blood lipids Have your blood tested for lipids and cholesterol at 55 years of age, then havethis test every 5 years. Have your cholesterol levels checked more often if: Your lipid or cholesterol levels are high. You are older than 55 years of age. You are at high risk for heart disease. What should I know about cancer screening? Depending on your health history and family history, you may need to have cancer screening at various ages. This may include screening for: Breast cancer. Cervical cancer. Colorectal cancer. Skin cancer. Lung cancer. What should I know about heart disease, diabetes, and high blood pressure? Blood pressure and heart  disease High blood pressure causes heart disease and increases the risk of stroke. This is more likely to develop in people who have high blood pressure readings, are of African descent, or are overweight. Have your blood pressure checked: Every 3-5 years if you are 18-39 years of age. Every year if you are 40 years old or older. Diabetes Have regular diabetes screenings. This checks your fasting blood sugar level. Have the screening done: Once every three years after age 40 if you are at a normal weight and have a low risk for diabetes. More often and at a younger age if you are overweight or have a high risk for diabetes. What should I know about preventing infection? Hepatitis B If you have a higher risk for hepatitis B, you should be screened for this virus. Talk with your health care provider to find out if you are at risk forhepatitis B infection. Hepatitis C Testing is recommended for: Everyone born from 1945 through 1965. Anyone with known risk factors for hepatitis C. Sexually transmitted infections (STIs) Get screened for STIs, including gonorrhea and chlamydia, if: You are sexually active and are younger than 55 years of age. You are older than 55 years of age and your health care provider tells you that you are at risk for this type of infection. Your sexual activity has changed since you were last screened, and you are at increased risk for chlamydia or gonorrhea. Ask your health care provider if you are at risk. Ask your health care provider about whether you are at high risk for HIV. Your health care provider may recommend a prescription medicine to help   prevent HIV infection. If you choose to take medicine to prevent HIV, you should first get tested for HIV. You should then be tested every 3 months for as long as you are taking the medicine. Pregnancy If you are about to stop having your period (premenopausal) and you may become pregnant, seek counseling before you get  pregnant. Take 400 to 800 micrograms (mcg) of folic acid every day if you become pregnant. Ask for birth control (contraception) if you want to prevent pregnancy. Osteoporosis and menopause Osteoporosis is a disease in which the bones lose minerals and strength with aging. This can result in bone fractures. If you are 65 years old or older, or if you are at risk for osteoporosis and fractures, ask your health care provider if you should: Be screened for bone loss. Take a calcium or vitamin D supplement to lower your risk of fractures. Be given hormone replacement therapy (HRT) to treat symptoms of menopause. Follow these instructions at home: Lifestyle Do not use any products that contain nicotine or tobacco, such as cigarettes, e-cigarettes, and chewing tobacco. If you need help quitting, ask your health care provider. Do not use street drugs. Do not share needles. Ask your health care provider for help if you need support or information about quitting drugs. Alcohol use Do not drink alcohol if: Your health care provider tells you not to drink. You are pregnant, may be pregnant, or are planning to become pregnant. If you drink alcohol: Limit how much you use to 0-1 drink a day. Limit intake if you are breastfeeding. Be aware of how much alcohol is in your drink. In the [place] one drink equals one 12 oz bottle of beer (355 mL), one 5 oz glass of wine (148 mL), or one 1 oz glass of hard liquor (44 mL). General instructions Schedule regular health, dental, and eye exams. Stay current with your vaccines. Tell your health care provider if: You often feel depressed. You have ever been abused or do not feel safe at home. Summary Adopting a healthy lifestyle and getting preventive care are important in promoting health and wellness. Follow your health care provider's instructions about healthy diet, exercising, and getting tested or screened for diseases. Follow your health care provider's  instructions on monitoring your cholesterol and blood pressure. This information is not intended to replace advice given to you by your health care provider. Make sure you discuss any questions you have with your healthcare provider. Document Revised: 01/20/2018 Document Reviewed: 01/20/2018 Elsevier Patient Education  2022 Elsevier Inc.  

## 2020-10-02 ENCOUNTER — Encounter: Payer: Self-pay | Admitting: Family Medicine

## 2020-10-03 ENCOUNTER — Ambulatory Visit: Payer: No Typology Code available for payment source | Admitting: Family Medicine

## 2020-10-03 LAB — DRUG MONITORING PANEL 376104, URINE
Amphetamines: NEGATIVE ng/mL
Barbiturates: NEGATIVE ng/mL
Benzodiazepines: NEGATIVE ng/mL
Cocaine Metabolite: NEGATIVE ng/mL
Desmethyltramadol: NEGATIVE ng/mL
Opiates: NEGATIVE ng/mL
Oxycodone: NEGATIVE ng/mL
Tramadol: NEGATIVE ng/mL

## 2020-10-03 LAB — IRON,TIBC AND FERRITIN PANEL
%SAT: 33 % (ref 16–45)
Ferritin: 30 ng/mL (ref 16–232)
Iron: 96 ug/dL (ref 45–160)
TIBC: 289 ug/dL (ref 250–450)

## 2020-10-03 LAB — DM TEMPLATE

## 2020-12-06 ENCOUNTER — Encounter: Payer: Self-pay | Admitting: Family Medicine

## 2020-12-06 ENCOUNTER — Ambulatory Visit: Payer: No Typology Code available for payment source | Admitting: Family Medicine

## 2020-12-06 NOTE — Progress Notes (Deleted)
OFFICE VISIT  12/06/2020  CC: No chief complaint on file.   HPI:    Patient is a 55 y.o. female who presents for urinary concerns.  INTERIM HX: ***  Past Medical History:  Diagnosis Date   Abnormal uterine bleeding    10/2019 LAVH/BS converted to TAH. Path ->fibroids,adenomyosis,nl cervix and fallopian tubes.   Anxiety    History of hematuria    History of sexually transmitted disease    trich, gonorrhea, lice   Insomnia    Iron deficiency anemia due to chronic blood loss    Dysf uter blee-->hysterectomy   Menopausal symptom    flushing and vag dryness->HRT 2020 per gyn   Migraine syndrome    S/P total abdominal hysterectomy 11/03/2019   Seasonal allergies    Urinary tract bacterial infections    Vitamin D deficiency    Vit D into normal range only with twice weekly 50K U vit D tab.    Past Surgical History:  Procedure Laterality Date   CESAREAN SECTION  1990   COLONOSCOPY  02/2019   [facility]   CYSTOSCOPY N/A 11/03/2019   Procedure: CYSTOSCOPY;  Surgeon: Sherian Rein, MD;  Location: [facility];  Service: Gynecology;  Laterality: N/A;  Possible   EYE SURGERY     Lasik   LAPAROSCOPIC VAGINAL HYSTERECTOMY WITH SALPINGECTOMY Bilateral 11/03/2019   cervical stump remains. Procedure: LAPAROSCOPIC ASSISTED ATTEMPTED  VAGINAL HYSTERECTOMY WITH SALPINGECTOMY, CONVERTED TO ABDOMINAL HYSTERECTOMY;  Surgeon: Sherian Rein, MD;  Location: [facility];  Service: Gynecology;  Laterality: Bilateral;   TUBAL LIGATION     TYMPANOPLASTY  2010    Outpatient Medications Prior to Visit  Medication Sig Dispense Refill   acetaminophen (TYLENOL) 650 MG CR tablet Take 650 mg by mouth daily as needed for pain. (Patient not taking: No sig reported)     estradiol (CLIMARA - DOSED IN MG/24 HR) 0.05 mg/24hr patch Place 0.05 mg onto the skin once a week.     estradiol (CLIMARA - DOSED IN MG/24 HR) 0.1 mg/24hr patch Place 0.1 mg onto the skin  once a week.     ferrous sulfate 325 (65 FE) MG tablet Take 1 tablet (325 mg total) by mouth daily with breakfast. 60 tablet 1   hydrocortisone cream 1 % Apply 1 application topically daily as needed for itching. (Patient not taking: No sig reported)     zolpidem (AMBIEN) 10 MG tablet 1 tab po qhs prn 30 tablet 5   No facility-administered medications prior to visit.    Allergies  Allergen Reactions   Sulfonamide Derivatives Hives   Penicillins Rash    ROS As per HPI  PE: Vitals with BMI 10/01/2020 03/07/2020 11/04/2019  Height 5\' 9"  5\' 9"  -  Weight 198 lbs 10 oz 186 lbs 13 oz -  BMI 29.31 27.57 -  Systolic 95 111 103  Diastolic 65 77 58  Pulse 62 71 57     ***  LABS:  ***  IMPRESSION AND PLAN:  No problem-specific Assessment & Plan notes found for this encounter.   An After Visit Summary was printed and given to the patient.  FOLLOW UP: No follow-ups on file.  Signed:  , MD           12/06/2020

## 2021-04-03 ENCOUNTER — Ambulatory Visit: Payer: No Typology Code available for payment source | Admitting: Family Medicine

## 2021-04-05 ENCOUNTER — Ambulatory Visit: Payer: No Typology Code available for payment source | Admitting: Family Medicine

## 2021-04-10 ENCOUNTER — Other Ambulatory Visit: Payer: Self-pay | Admitting: Family Medicine

## 2021-04-11 NOTE — Telephone Encounter (Signed)
Requesting: zolpidem ?Contract: 10/01/20 ?UDS: 10/01/20 ?Last Visit: 10/01/20 ?Next Visit: 05/06/21 ?Last Refill: 10/01/20(30,5) ? ?Please Advise. Med pending ?

## 2021-05-06 ENCOUNTER — Encounter: Payer: Self-pay | Admitting: Family Medicine

## 2021-05-06 ENCOUNTER — Other Ambulatory Visit: Payer: Self-pay

## 2021-05-06 ENCOUNTER — Ambulatory Visit (INDEPENDENT_AMBULATORY_CARE_PROVIDER_SITE_OTHER): Payer: No Typology Code available for payment source | Admitting: Family Medicine

## 2021-05-06 VITALS — BP 111/78 | HR 65 | Ht 69.0 in | Wt 193.6 lb

## 2021-05-06 DIAGNOSIS — F5104 Psychophysiologic insomnia: Secondary | ICD-10-CM | POA: Diagnosis not present

## 2021-05-06 DIAGNOSIS — D500 Iron deficiency anemia secondary to blood loss (chronic): Secondary | ICD-10-CM

## 2021-05-06 LAB — CBC
HCT: 36.8 % (ref 36.0–46.0)
Hemoglobin: 12.2 g/dL (ref 12.0–15.0)
MCHC: 33.2 g/dL (ref 30.0–36.0)
MCV: 85.9 fl (ref 78.0–100.0)
Platelets: 224 10*3/uL (ref 150.0–400.0)
RBC: 4.28 Mil/uL (ref 3.87–5.11)
RDW: 13.4 % (ref 11.5–15.5)
WBC: 3.8 10*3/uL — ABNORMAL LOW (ref 4.0–10.5)

## 2021-05-06 MED ORDER — ZOLPIDEM TARTRATE 10 MG PO TABS: ORAL_TABLET | ORAL | 5 refills | Status: DC

## 2021-05-06 NOTE — Progress Notes (Signed)
OFFICE VISIT ? ?05/06/2021 ? ?CC:  ?Chief Complaint  ?Patient presents with  ? Insomnia  ? ? ?HPI:   ? ?Patient is a 56 y.o. 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 ?A/P as of last visit:  ?"1) Anxiety and anxiety-induced insomnia, high risk med use: ?CSC UTD. ?UDS today.  ?Ambien 10mg , 1 qhs prn, #30, RF x 5. ?  ?2) Hx of IDA d/t DUB and subsequent hysterectomy last year. ?Has been on iron supplement since that time.   ?CBC and iron panel today. ?  ?3) Health maintenance exam: ?Reviewed age and gender appropriate health maintenance issues (prudent diet, regular exercise, health risks of tobacco and excessive alcohol, use of seatbelts, fire alarms in home, use of sunscreen).  Also reviewed age and gender appropriate health screening as well as vaccine recommendations. ?Vaccines: ALL UTD. ?Labs: fasting HP, iron panel. ?Cervical ca screening: per GYN MD. ?Breast ca screening: per GYN MD. ?Colon ca screening: per pt report, 2021 colonoscopy normal ([facility]): will obtain records." ? ?INTERIM HX: ?Hemoglobin 11.6 and iron levels normal last visit. ?Recommended she cont iron qd and she does take this most days. ?She is doing well.  Taking Ambien most nights and this works well for her and there are no adverse side effects. ? ?She takes Climara patch for menopausal symptoms per her GYN. ?The hot flashes are spacing further and further apart. ? ?PMP AWARE reviewed today: most recent rx for Ambien was filled 04/11/21, #30, rx by me. ?No red flags. ? ?Past Medical History:  ?Diagnosis Date  ? Abnormal uterine bleeding   ? 10/2019 LAVH/BS converted to TAH. Path ->fibroids,adenomyosis,nl cervix and fallopian tubes.  ? Anxiety   ? History of hematuria   ? History of sexually transmitted disease   ? trich, gonorrhea, lice  ? Insomnia   ? Iron deficiency anemia due to chronic blood loss   ? Dysf uter blee-->hysterectomy  ? Menopausal symptom   ? flushing and vag  dryness->HRT 2020 per gyn  ? Migraine syndrome   ? S/P total abdominal hysterectomy 11/03/2019  ? Seasonal allergies   ? Urinary tract bacterial infections   ? Vitamin D deficiency   ? Vit D into normal range only with twice weekly 50K U vit D tab.  ? ? ?Past Surgical History:  ?Procedure Laterality Date  ? CESAREAN SECTION  1990  ? COLONOSCOPY  02/2019  ? [facility]  ? CYSTOSCOPY N/A 11/03/2019  ? Procedure: CYSTOSCOPY;  Surgeon: 11/05/2019, MD;  Location: [facility];  Service: Gynecology;  Laterality: N/A;  Possible  ? EYE SURGERY    ? Lasik  ? LAPAROSCOPIC VAGINAL HYSTERECTOMY WITH SALPINGECTOMY Bilateral 11/03/2019  ? cervical stump remains. Procedure: LAPAROSCOPIC ASSISTED ATTEMPTED  VAGINAL HYSTERECTOMY WITH SALPINGECTOMY, CONVERTED TO ABDOMINAL HYSTERECTOMY;  Surgeon: 11/05/2019, MD;  Location: [facility];  Service: Gynecology;  Laterality: Bilateral;  ? TUBAL LIGATION    ? TYMPANOPLASTY  2010  ? ? ?Outpatient Medications Prior to Visit  ?Medication Sig Dispense Refill  ? estradiol (CLIMARA - DOSED IN MG/24 HR) 0.05 mg/24hr patch Place 0.05 mg onto the skin once a week.    ? estradiol (CLIMARA - DOSED IN MG/24 HR) 0.1 mg/24hr patch Place 0.1 mg onto the skin once a week.    ? ferrous sulfate 325 (65 FE) MG tablet Take 1 tablet (325 mg total) by mouth daily with breakfast. 60 tablet 1  ?  acetaminophen (TYLENOL) 650 MG CR tablet Take 650 mg by mouth daily as needed for pain. (Patient not taking: Reported on 03/07/2020)    ? hydrocortisone cream 1 % Apply 1 application topically daily as needed for itching. (Patient not taking: Reported on 03/07/2020)    ? zolpidem (AMBIEN) 10 MG tablet TAKE 1 TABLET BY MOUTH EVERY DAY AT BEDTIME AS NEEDED 30 tablet 0  ? ?No facility-administered medications prior to visit.  ? ? ?Allergies  ?Allergen Reactions  ? Sulfonamide Derivatives Hives  ? Penicillins Rash  ? ? ?ROS ?As per HPI ? ?PE: ? ?  05/06/2021  ?  9:34 AM  10/01/2020  ? 10:50 AM 03/07/2020  ?  3:06 PM  ?Vitals with BMI  ?Height 5\' 9"  5\' 9"  5\' 9"   ?Weight 193 lbs 10 oz 198 lbs 10 oz 186 lbs 13 oz  ?BMI 28.58 29.31 27.57  ?Systolic 111 95 111  ?Diastolic 78 65 77  ?Pulse 65 62 71  ? ? ? ?Physical Exam ? ?Gen: Alert, well appearing.  Patient is oriented to person, place, time, and situation. ?AFFECT: pleasant, lucid thought and speech. ?No further exam today. ? ?LABS:  ?Last CBC ?Lab Results  ?Component Value Date  ? WBC 4.3 10/01/2020  ? HGB 11.6 (L) 10/01/2020  ? HCT 35.3 (L) 10/01/2020  ? MCV 86.5 10/01/2020  ? MCH 29.3 11/04/2019  ? RDW 13.6 10/01/2020  ? PLT 257.0 10/01/2020  ? ?Lab Results  ?Component Value Date  ? IRON 96 10/01/2020  ? TIBC 289 10/01/2020  ? FERRITIN 30 10/01/2020  ? ?Last metabolic panel ?Lab Results  ?Component Value Date  ? GLUCOSE 75 10/01/2020  ? NA 138 10/01/2020  ? K 3.9 10/01/2020  ? CL 103 10/01/2020  ? CO2 27 10/01/2020  ? BUN 11 10/01/2020  ? CREATININE 0.66 10/01/2020  ? GFRNONAA >60 11/04/2019  ? CALCIUM 8.9 10/01/2020  ? PHOS 3.2 08/12/2012  ? PROT 7.3 10/01/2020  ? ALBUMIN 4.0 10/01/2020  ? BILITOT 0.5 10/01/2020  ? ALKPHOS 80 10/01/2020  ? AST 14 10/01/2020  ? ALT 14 10/01/2020  ? ANIONGAP 5 11/04/2019  ? ?Last lipids ?Lab Results  ?Component Value Date  ? CHOL 185 10/01/2020  ? HDL 63.20 10/01/2020  ? LDLCALC 111 (H) 10/01/2020  ? TRIG 58.0 10/01/2020  ? CHOLHDL 3 10/01/2020  ? ?Last hemoglobin A1c ?Lab Results  ?Component Value Date  ? HGBA1C 5.4 08/15/2014  ? ?IMPRESSION AND PLAN: ? ?1) Anxiety and anxiety-induced insomnia ?Doing very well, managing anxiety well, keeping busy taking care of grandchildren, says she is happy! ?Ambien 10mg  qhs doing well/stable. ?CSC UTD. ?UDS UTD. ?Ambien 10mg , 1 qhs prn, #30, RF x 5. ?  ?2) Hx of IDA d/t DUB and subsequent hysterectomy 2021. ?Has been on iron supplement since that time.   ?CBC and iron panel today. ? ?An After Visit Summary was printed and given to the patient. ? ?FOLLOW UP: Return  in about 6 months (around 11/06/2021) for annual CPE (fasting). ? ?Signed:  10/16/2014, MD           05/06/2021 ? ?

## 2021-05-07 LAB — IBC + FERRITIN
Ferritin: 34.9 ng/mL (ref 10.0–291.0)
Iron: 102 ug/dL (ref 42–145)
Saturation Ratios: 37.2 % (ref 20.0–50.0)
TIBC: 274.4 ug/dL (ref 250.0–450.0)
Transferrin: 196 mg/dL — ABNORMAL LOW (ref 212.0–360.0)

## 2021-06-04 IMAGING — US US PELVIS COMPLETE WITH TRANSVAGINAL
1 series · 13 of 25 positions shown · non-contrast
Comparison: None

CLINICAL DATA: Postmenopausal bleeding.  LMP 1 year ago



[Series 1: us pelvis complete with transvaginal · 13 of 85 slices shown]
[im 1/85]
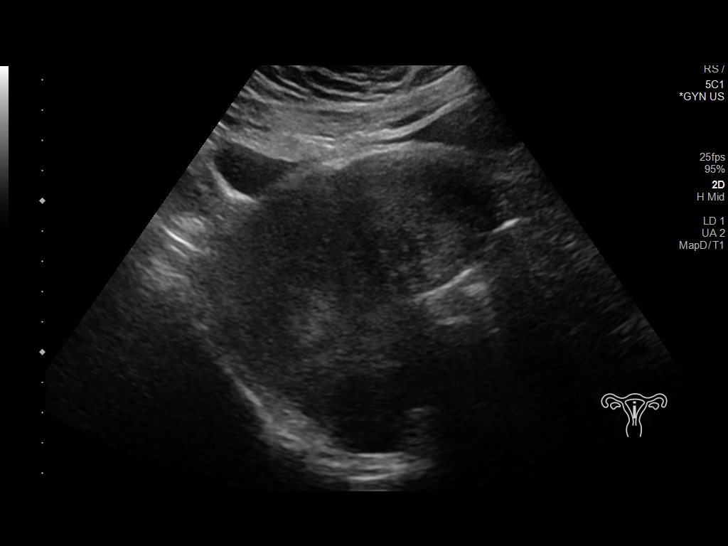
[im 8/85]
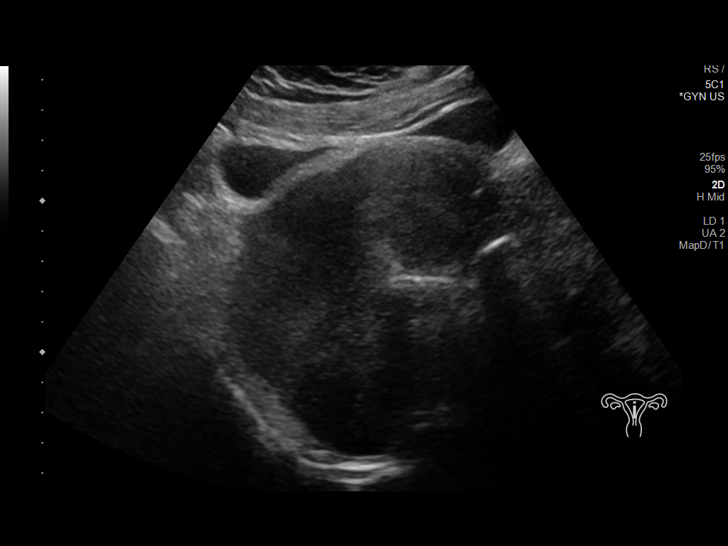
[im 15/85]
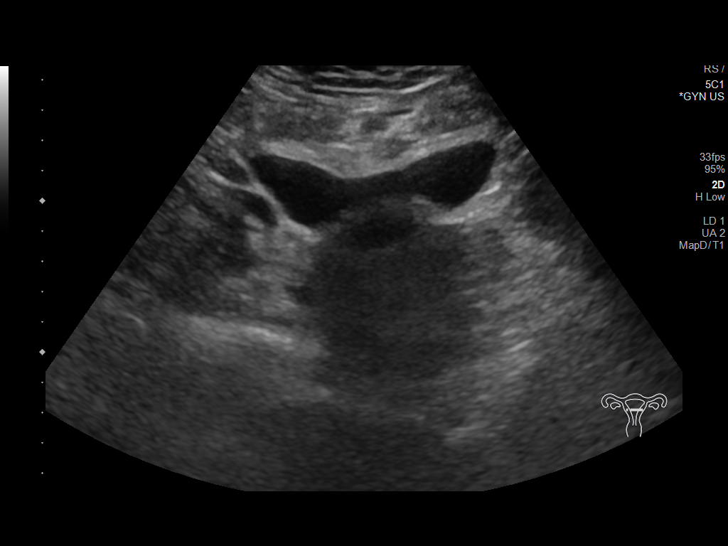
[im 22/85]
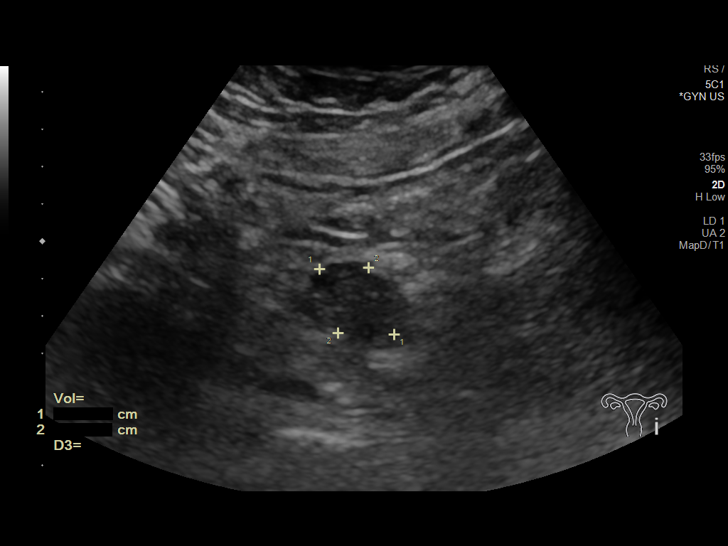
[im 29/85]
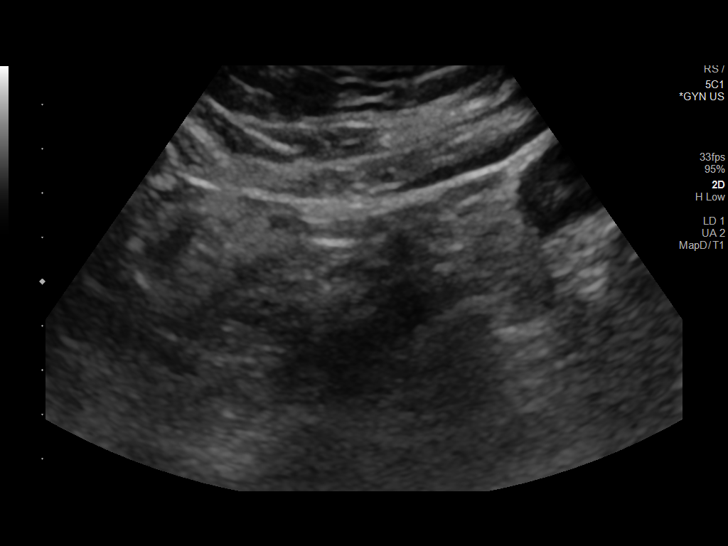
[im 36/85]
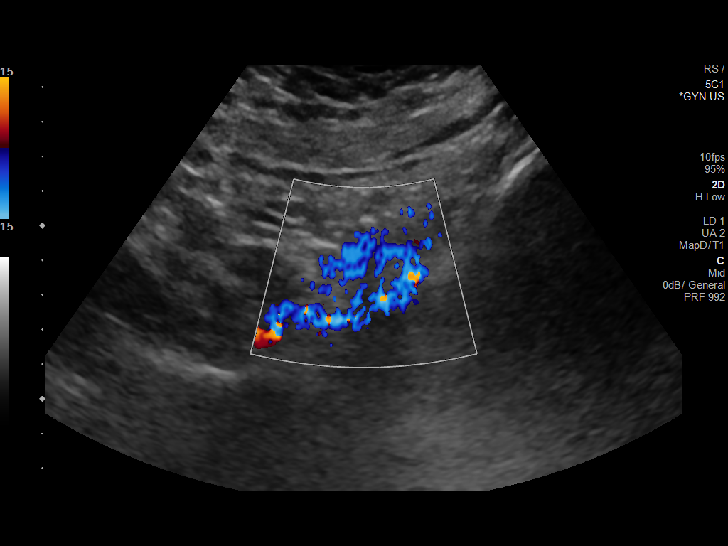
[im 43/85]
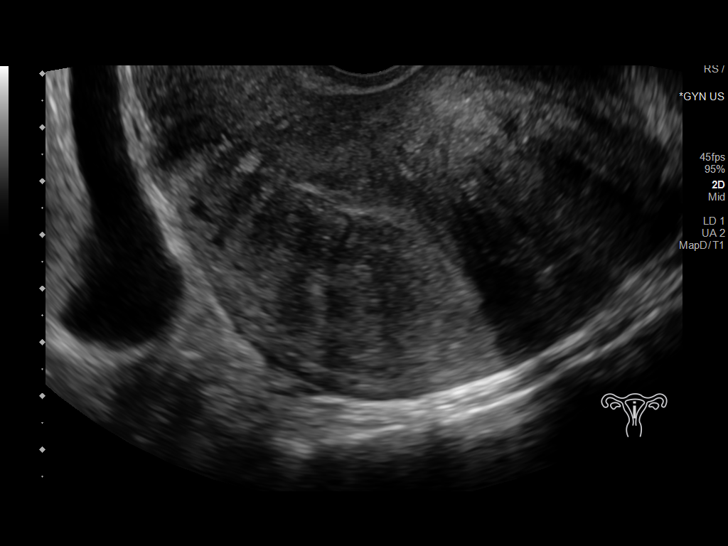
[im 50/85]
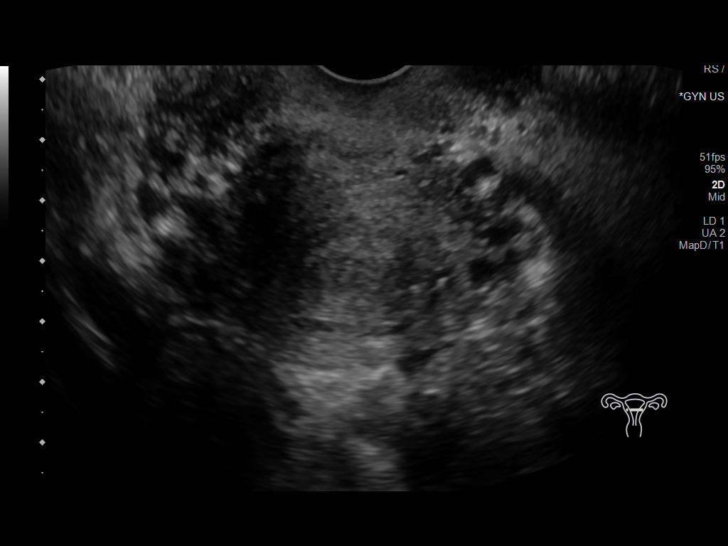
[im 57/85]
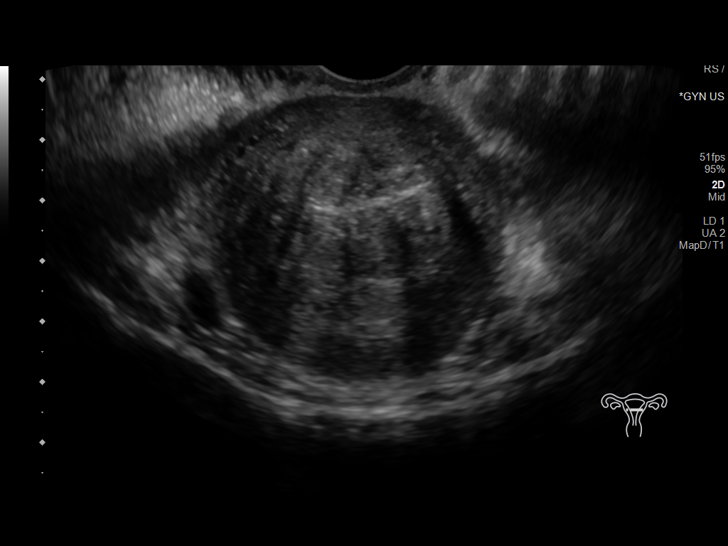
[im 64/85]
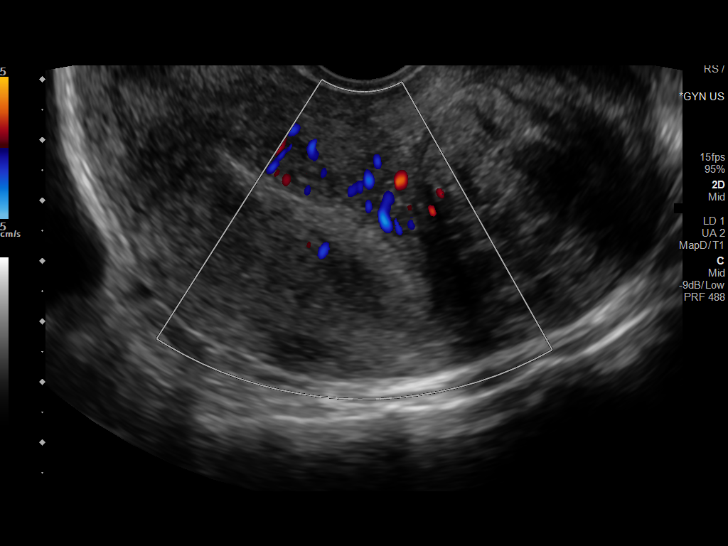
[im 71/85]
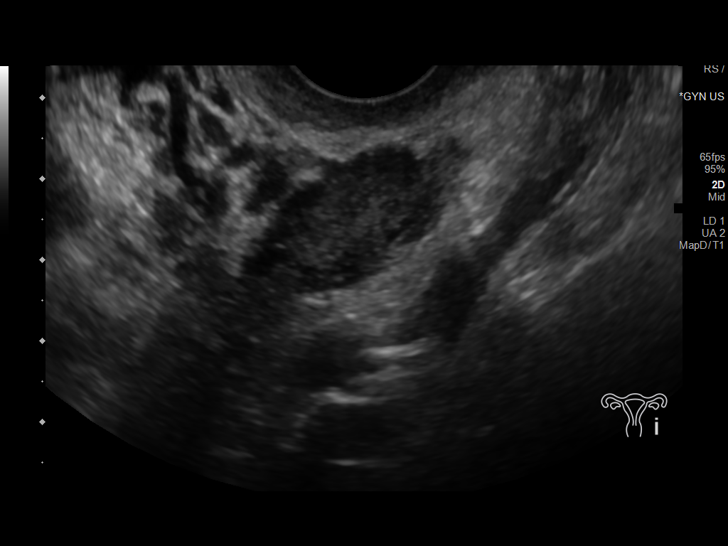
[im 78/85]
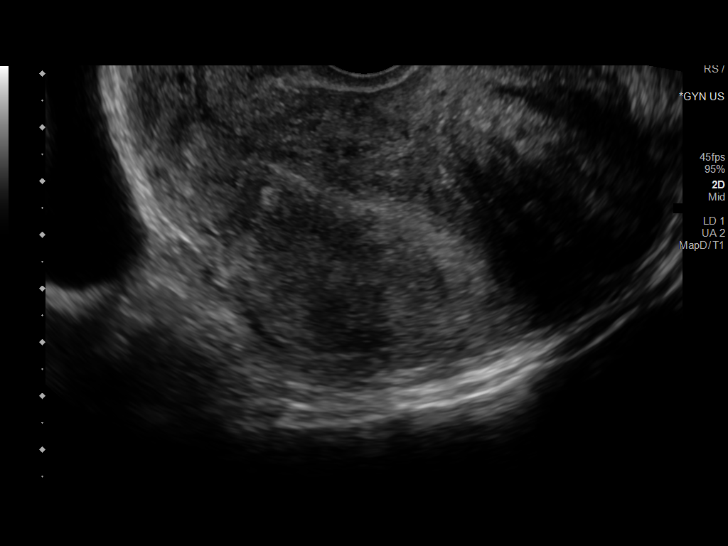
[im 85/85]
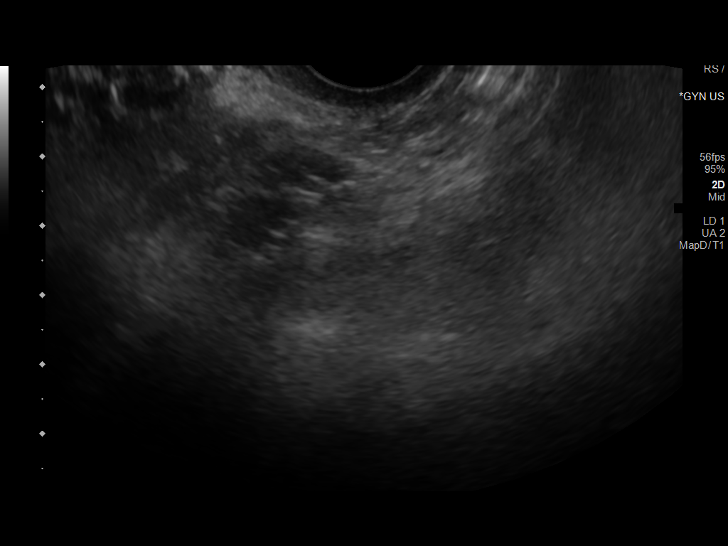

[13 of 25 positions shown; findings below may reference images not displayed]

FINDINGS: Uterus

Measurements: 9.5 x 6.3 x 6.2 cm = volume: 191 mL. Retroflexed.
Subserosal fundal fibroid measuring 4.9 x 4.4 x 4.6 cm.

Endometrium

Thickness: 11 mm.  No focal abnormality visualized.

Right ovary

Measurements: 2.6 x 1.5 x 1.6 cm = volume: 3 mL. Normal
appearance/no adnexal mass.

Left ovary

Measurements: 2.7 x 1.6 x 2.2 cm = volume: 5 mL. Normal
appearance/no adnexal mass.

Other findings

Trace free fluid within the cul-de-sac.
IMPRESSION: 1. Endometrial thickness of 11 mm. In the setting of post-menopausal
bleeding, endometrial sampling is indicated to exclude carcinoma. If
results are benign, sonohysterogram should be considered for focal
lesion work-up. (Ref: Radiological Reasoning: Algorithmic Workup of
Abnormal Vaginal Bleeding with Endovaginal Sonography and
Sonohysterography. AJR [id number]; 191:S68-73)
2. Uterine fundal fibroid measuring to 4.9 cm.
3. Unremarkable sonographic appearance of the ovaries.

## 2021-08-16 ENCOUNTER — Ambulatory Visit: Payer: No Typology Code available for payment source | Admitting: Family Medicine

## 2021-10-11 LAB — CBC AND DIFFERENTIAL: Hemoglobin: 9.5 — AB (ref 12.0–16.0)

## 2021-10-14 LAB — CBC AND DIFFERENTIAL: Hemoglobin: 9.5 — AB (ref 12.0–16.0)

## 2021-10-17 ENCOUNTER — Other Ambulatory Visit: Payer: Self-pay | Admitting: Physical Medicine and Rehabilitation

## 2021-10-17 DIAGNOSIS — R928 Other abnormal and inconclusive findings on diagnostic imaging of breast: Secondary | ICD-10-CM

## 2021-11-11 ENCOUNTER — Ambulatory Visit (INDEPENDENT_AMBULATORY_CARE_PROVIDER_SITE_OTHER): Payer: No Typology Code available for payment source | Admitting: Family Medicine

## 2021-11-11 ENCOUNTER — Encounter: Payer: Self-pay | Admitting: Family Medicine

## 2021-11-11 VITALS — BP 94/65 | HR 66 | Temp 98.0°F | Ht 70.0 in | Wt 201.0 lb

## 2021-11-11 DIAGNOSIS — D500 Iron deficiency anemia secondary to blood loss (chronic): Secondary | ICD-10-CM | POA: Diagnosis not present

## 2021-11-11 DIAGNOSIS — Z0000 Encounter for general adult medical examination without abnormal findings: Secondary | ICD-10-CM | POA: Diagnosis not present

## 2021-11-11 DIAGNOSIS — F5104 Psychophysiologic insomnia: Secondary | ICD-10-CM | POA: Diagnosis not present

## 2021-11-11 DIAGNOSIS — Z79899 Other long term (current) drug therapy: Secondary | ICD-10-CM

## 2021-11-11 LAB — CBC
HCT: 35.9 % — ABNORMAL LOW (ref 36.0–46.0)
Hemoglobin: 11.8 g/dL — ABNORMAL LOW (ref 12.0–15.0)
MCHC: 32.9 g/dL (ref 30.0–36.0)
MCV: 85.6 fl (ref 78.0–100.0)
Platelets: 229 10*3/uL (ref 150.0–400.0)
RBC: 4.2 Mil/uL (ref 3.87–5.11)
RDW: 13.1 % (ref 11.5–15.5)
WBC: 4.8 10*3/uL (ref 4.0–10.5)

## 2021-11-11 MED ORDER — ZOLPIDEM TARTRATE 10 MG PO TABS: ORAL_TABLET | ORAL | 5 refills | Status: DC

## 2021-11-11 NOTE — Patient Instructions (Signed)

## 2021-11-11 NOTE — Progress Notes (Signed)
Office Note 11/11/2021  CC: No chief complaint on file.  Patient is a 56 y.o. 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 At her last visit with me all was stable, iron and hemoglobin were normal.  INTERIM HX: Hemoglobin at her GYN visit 1 month ago was 9.5. Has taken her iron sporadically. Hx menorrhagia but no menses in last 2 yrs.  Constantly working or babysitting.  Not overly tired.  Taking ambien most nights and it helps.  Past Medical History:  Diagnosis Date   Abnormal uterine bleeding    10/2019 LAVH/BS converted to TAH. Path ->fibroids,adenomyosis,nl cervix and fallopian tubes.   Anxiety    History of hematuria    History of sexually transmitted disease    trich, gonorrhea, lice   Insomnia    Iron deficiency anemia due to chronic blood loss    Dysf uter blee-->hysterectomy   Menopausal symptom    flushing and vag dryness->HRT 2020 per gyn   Migraine syndrome    S/P total abdominal hysterectomy 11/03/2019   Seasonal allergies    Urinary tract bacterial infections    Vitamin D deficiency    Vit D into normal range only with twice weekly 50K U vit D tab.    Past Surgical History:  Procedure Laterality Date   CESAREAN SECTION  1990   COLONOSCOPY  02/2019   [facility]   CYSTOSCOPY N/A 11/03/2019   Procedure: CYSTOSCOPY;  Surgeon: Janyth Contes, MD;  Location: [place];  Service: Gynecology;  Laterality: N/A;  Possible   EYE SURGERY     Lasik   LAPAROSCOPIC VAGINAL HYSTERECTOMY WITH SALPINGECTOMY Bilateral 11/03/2019   cervical stump remains. Procedure: LAPAROSCOPIC ASSISTED ATTEMPTED  VAGINAL HYSTERECTOMY WITH SALPINGECTOMY, CONVERTED TO ABDOMINAL HYSTERECTOMY;  Surgeon: Janyth Contes, MD;  Location: [place];  Service: Gynecology;  Laterality: Bilateral;   TUBAL LIGATION     TYMPANOPLASTY  2010    Family History   Problem Relation Age of Onset   Hypertension Mother    Dementia Mother    Heart disease Father    Cancer Father    Breast cancer Paternal Aunt        85's     Social History   Socioeconomic History   Marital status: Married    Spouse name: Not on file   Number of children: 2   Years of education: Not on file   Highest education level: Not on file  Occupational History    Employer: [employer]  Tobacco Use   Smoking status: Never   Smokeless tobacco: Never  Vaping Use   Vaping Use: Never used  Substance and Sexual Activity   Alcohol use: No   Drug use: No   Sexual activity: Yes    Birth control/protection: Surgical  Other Topics Concern   Not on file  Social History Narrative   Ms Angelo lives with her husband, works for [employer], and has two grown children. Her son serves in the [employer] and is currently serving in [place].  She will be going to [place] 04/2014 to visit him.  Her daughter studied psychology at [employer] and has returned for graduate studies.    No T/A/Ds.   Social Determinants of Health   Financial Resource Strain: Not on file  Food Insecurity: Not on file  Transportation Needs: Not on file  Physical Activity: Not on file  Stress: Not on file  Social Connections: Not on file  Intimate Partner Violence: Not on file    Outpatient Medications Prior to Visit  Medication Sig Dispense Refill   estradiol (CLIMARA - DOSED IN MG/24 HR) 0.05 mg/24hr patch Place 0.05 mg onto the skin once a week.     estradiol (CLIMARA - DOSED IN MG/24 HR) 0.1 mg/24hr patch Place 0.1 mg onto the skin once a week.     ferrous sulfate 325 (65 FE) MG tablet Take 1 tablet (325 mg total) by mouth daily with breakfast. 60 tablet 1   zolpidem (AMBIEN) 10 MG tablet TAKE 1 TABLET BY MOUTH EVERY DAY AT BEDTIME AS NEEDED 30 tablet 5   acetaminophen (TYLENOL) 650 MG CR tablet Take 650 mg by mouth daily as needed for pain. (Patient not taking: Reported on 03/07/2020)      hydrocortisone cream 1 % Apply 1 application topically daily as needed for itching. (Patient not taking: Reported on 03/07/2020)     No facility-administered medications prior to visit.    Allergies  Allergen Reactions   Sulfonamide Derivatives Hives   Penicillins Rash    ROS Review of Systems  Constitutional:  Negative for appetite change, chills, fatigue and fever.  HENT:  Negative for congestion, dental problem, ear pain and sore throat.   Eyes:  Negative for discharge, redness and visual disturbance.  Respiratory:  Negative for cough, chest tightness, shortness of breath and wheezing.   Cardiovascular:  Negative for chest pain, palpitations and leg swelling.  Gastrointestinal:  Negative for abdominal pain, blood in stool, diarrhea, nausea and vomiting.  Genitourinary:  Negative for difficulty urinating, dysuria, flank pain, frequency, hematuria and urgency.  Musculoskeletal:  Negative for arthralgias, back pain, joint swelling, myalgias and neck stiffness.  Skin:  Negative for pallor and rash.  Neurological:  Negative for dizziness, speech difficulty, weakness and headaches.  Hematological:  Negative for adenopathy. Does not bruise/bleed easily.  Psychiatric/Behavioral:  Negative for confusion and sleep disturbance. The patient is not nervous/anxious.     PE;    11/11/2021    8:03 AM 05/06/2021    9:34 AM 10/01/2020   10:50 AM  Vitals with BMI  Height 5\' 10"  5\' 9"  5\' 9"   Weight 201 lbs 193 lbs 10 oz 198 lbs 10 oz  BMI 28.84 28.58 29.31  Systolic 94 111 95  Diastolic 65 78 65  Pulse 66 65 62   Exam chaperoned by , CMA. Gen: Alert, well appearing.  Patient is oriented to person, place, time, and situation. AFFECT: pleasant, lucid thought and speech. ENT: Ears: EACs clear, normal epithelium.  TMs with good light reflex and landmarks bilaterally.  Eyes: no injection, icteris, swelling, or exudate.  EOMI, PERRLA. Nose: no drainage or turbinate edema/swelling.   No injection or focal lesion.  Mouth: lips without lesion/swelling.  Oral mucosa pink and moist.  Dentition intact and without obvious caries or gingival swelling.  Oropharynx without erythema, exudate, or swelling.  Neck: supple/nontender.  No LAD, mass, or TM.  Carotid pulses 2+ bilaterally, without bruits. CV: RRR, no m/r/g.   LUNGS: CTA bilat, nonlabored resps, good aeration in all lung fields. ABD: soft, NT, ND, BS normal.  No hepatospenomegaly or mass.  No bruits. EXT: no clubbing, cyanosis, or edema.  Musculoskeletal: no joint swelling, erythema, warmth, or tenderness.  ROM of all joints intact. Skin - no sores or suspicious lesions or rashes or color changes  Pertinent labs:  Lab Results  Component Value Date   TSH 0.79 10/01/2020  Lab Results  Component Value Date   WBC 3.8 (L) 05/06/2021   HGB 9.5 (A) 10/14/2021   HCT 36.8 05/06/2021   MCV 85.9 05/06/2021   PLT 224.0 05/06/2021   Lab Results  Component Value Date   IRON 102 05/06/2021   TIBC 274.4 05/06/2021   FERRITIN 34.9 05/06/2021   Lab Results  Component Value Date   CREATININE 0.66 10/01/2020   BUN 11 10/01/2020   NA 138 10/01/2020   K 3.9 10/01/2020   CL 103 10/01/2020   CO2 27 10/01/2020   Lab Results  Component Value Date   ALT 14 10/01/2020   AST 14 10/01/2020   ALKPHOS 80 10/01/2020   BILITOT 0.5 10/01/2020   Lab Results  Component Value Date   CHOL 185 10/01/2020   Lab Results  Component Value Date   HDL 63.20 10/01/2020   Lab Results  Component Value Date   LDLCALC 111 (H) 10/01/2020   Lab Results  Component Value Date   TRIG 58.0 10/01/2020   Lab Results  Component Value Date   CHOLHDL 3 10/01/2020   Lab Results  Component Value Date   HGBA1C 5.4 08/15/2014   ASSESSMENT AND PLAN:   #1 iron deficiency anemia. In the past this has been to menorrhagia. She has not had a menses in 2 years now. Her hemoglobin and iron levels were normal when I saw her back in March this  year. At GYN office last month hemoglobin was 9.5. Checking CBC, iron, and Hemoccults today. Continue daily iron tablet.  #2 insomnia. Doing well on Ambien long-term.  10 mg tabs, #30, refill x5.  #3 Health maintenance exam: Reviewed age and gender appropriate health maintenance issues (prudent diet, regular exercise, health risks of tobacco and excessive alcohol, use of seatbelts, fire alarms in home, use of sunscreen).  Also reviewed age and gender appropriate health screening as well as vaccine recommendations. Vaccines: Shingirx->deferred.  Flu->UTD. Labs: fasting HP, iron panel. Cervical ca screening: per GYN MD. Breast ca screening: per GYN MD.  Recent abnl mammo-->f/u imaging arranged. Colon ca screening: recall 2031  An After Visit Summary was printed and given to the patient.  FOLLOW UP:  No follow-ups on file.  Signed:  Santiago Bumpers, MD           11/11/2021

## 2021-11-12 LAB — COMPREHENSIVE METABOLIC PANEL
AG Ratio: 1.2 (calc) (ref 1.0–2.5)
ALT: 19 U/L (ref 6–29)
AST: 22 U/L (ref 10–35)
Albumin: 3.7 g/dL (ref 3.6–5.1)
Alkaline phosphatase (APISO): 80 U/L (ref 37–153)
BUN: 10 mg/dL (ref 7–25)
CO2: 26 mmol/L (ref 20–32)
Calcium: 8.8 mg/dL (ref 8.6–10.4)
Chloride: 104 mmol/L (ref 98–110)
Creat: 0.72 mg/dL (ref 0.50–1.03)
Globulin: 3.2 g/dL (calc) (ref 1.9–3.7)
Glucose, Bld: 84 mg/dL (ref 65–99)
Potassium: 4.5 mmol/L (ref 3.5–5.3)
Sodium: 138 mmol/L (ref 135–146)
Total Bilirubin: 0.3 mg/dL (ref 0.2–1.2)
Total Protein: 6.9 g/dL (ref 6.1–8.1)

## 2021-11-12 LAB — IRON,TIBC AND FERRITIN PANEL
%SAT: 20 % (calc) (ref 16–45)
Ferritin: 46 ng/mL (ref 16–232)
Iron: 52 ug/dL (ref 45–160)
TIBC: 256 mcg/dL (calc) (ref 250–450)

## 2021-11-12 LAB — LIPID PANEL
Cholesterol: 185 mg/dL (ref <200)
HDL: 59 mg/dL (ref >=50)
LDL Cholesterol (Calc): 108 mg/dL (calc) — ABNORMAL HIGH
Non-HDL Cholesterol (Calc): 126 mg/dL (calc) (ref <130)
Total CHOL/HDL Ratio: 3.1 (calc) (ref <5.0)
Triglycerides: 86 mg/dL (ref <150)

## 2021-11-12 LAB — TSH: TSH: 1.93 mIU/L

## 2021-11-18 ENCOUNTER — Ambulatory Visit
Admission: RE | Admit: 2021-11-18 | Discharge: 2021-11-18 | Disposition: A | Discharge status: Home / Self Care | Payer: No Typology Code available for payment source | Source: Ambulatory Visit | Attending: Physical Medicine and Rehabilitation | Admitting: Physical Medicine and Rehabilitation

## 2021-11-18 DIAGNOSIS — R928 Other abnormal and inconclusive findings on diagnostic imaging of breast: Secondary | ICD-10-CM

## 2021-12-09 ENCOUNTER — Ambulatory Visit: Payer: No Typology Code available for payment source | Admitting: Family Medicine

## 2021-12-10 ENCOUNTER — Other Ambulatory Visit: Payer: No Typology Code available for payment source

## 2021-12-23 ENCOUNTER — Telehealth: Payer: No Typology Code available for payment source | Admitting: Family Medicine

## 2021-12-30 ENCOUNTER — Encounter: Payer: Self-pay | Admitting: Family Medicine

## 2021-12-30 ENCOUNTER — Telehealth (INDEPENDENT_AMBULATORY_CARE_PROVIDER_SITE_OTHER): Payer: No Typology Code available for payment source | Admitting: Family Medicine

## 2021-12-30 VITALS — Ht 70.0 in

## 2021-12-30 DIAGNOSIS — J209 Acute bronchitis, unspecified: Secondary | ICD-10-CM | POA: Diagnosis not present

## 2021-12-30 DIAGNOSIS — R062 Wheezing: Secondary | ICD-10-CM | POA: Diagnosis not present

## 2021-12-30 DIAGNOSIS — J069 Acute upper respiratory infection, unspecified: Secondary | ICD-10-CM | POA: Diagnosis not present

## 2021-12-30 MED ORDER — AZITHROMYCIN 250 MG PO TABS: ORAL_TABLET | ORAL | 0 refills | Status: DC

## 2021-12-30 MED ORDER — PREDNISONE 20 MG PO TABS: ORAL_TABLET | ORAL | 0 refills | Status: DC

## 2021-12-30 MED ORDER — ALBUTEROL SULFATE HFA 108 (90 BASE) MCG/ACT IN AERS: INHALATION_SPRAY | RESPIRATORY_TRACT | 0 refills | Status: DC

## 2021-12-30 NOTE — Progress Notes (Signed)
Virtual Visit via Video Note  I connected with Colleen Ruiz on 12/30/21 at  4:00 PM EST by a video enabled telemedicine application and verified that I am speaking with the correct person using two identifiers.  Location patient: [place] Location provider:work or home office Persons participating in the virtual visit: patient, provider  I discussed the limitations and requested verbal permission for telemedicine visit. The patient expressed understanding and agreed to proceed.  CC: nasal congestion.  HPI: 56 year old female being seen today for a 2-week history of nasal congestion, postnasal drip, sinus pressure, and cough.  Initially had some subjective fever but none since the first couple of days. Hears some wheezing.  Coughs in fits, worse at night. No shortness of breath, no chest pain. Trying Mucinex DM as well as some Tessalon Perles that she had left from a prior prescription.  ROS: See pertinent positives and negatives per HPI.  Past Medical History:  Diagnosis Date   Abnormal uterine bleeding    10/2019 LAVH/BS converted to TAH. Path ->fibroids,adenomyosis,nl cervix and fallopian tubes.   Anxiety    History of hematuria    History of sexually transmitted disease    trich, gonorrhea, lice   Insomnia    Iron deficiency anemia due to chronic blood loss    Dysf uter blee-->hysterectomy   Menopausal symptom    flushing and vag dryness->HRT 2020 per gyn   Migraine syndrome    S/P total abdominal hysterectomy 11/03/2019   Seasonal allergies    Urinary tract bacterial infections    Vitamin D deficiency    Vit D into normal range only with twice weekly 50K U vit D tab.    Past Surgical History:  Procedure Laterality Date   CESAREAN SECTION  1990   COLONOSCOPY  02/2019   [facility]   CYSTOSCOPY N/A 11/03/2019   Procedure: CYSTOSCOPY;  Surgeon: Janyth Contes, MD;  Location: [place];  Service: Gynecology;  Laterality: N/A;  Possible   EYE SURGERY      Lasik   LAPAROSCOPIC VAGINAL HYSTERECTOMY WITH SALPINGECTOMY Bilateral 11/03/2019   cervical stump remains. Procedure: LAPAROSCOPIC ASSISTED ATTEMPTED  VAGINAL HYSTERECTOMY WITH SALPINGECTOMY, CONVERTED TO ABDOMINAL HYSTERECTOMY;  Surgeon: Janyth Contes, MD;  Location: [place];  Service: Gynecology;  Laterality: Bilateral;   TUBAL LIGATION     TYMPANOPLASTY  2010     Current Outpatient Medications:    estradiol (CLIMARA - DOSED IN MG/24 HR) 0.05 mg/24hr patch, Place 0.05 mg onto the skin once a week., Disp: , Rfl:    estradiol (CLIMARA - DOSED IN MG/24 HR) 0.1 mg/24hr patch, Place 0.1 mg onto the skin once a week., Disp: , Rfl:    ferrous sulfate 325 (65 FE) MG tablet, Take 1 tablet (325 mg total) by mouth daily with breakfast., Disp: 60 tablet, Rfl: 1   zolpidem (AMBIEN) 10 MG tablet, TAKE 1 TABLET BY MOUTH EVERY DAY AT BEDTIME AS NEEDED, Disp: 30 tablet, Rfl: 5  EXAM:  VITALS per patient if applicable:     [id number]    8:03 AM 05/06/2021    9:34 AM 10/01/2020   10:50 AM  Vitals with BMI  Height 5\' 10"  5\' 9"  5\' 9"   Weight 201 lbs 193 lbs 10 oz 198 lbs 10 oz  BMI 28.84 [id number]  Systolic 94 [id number] 95  Diastolic 65 78 65  Pulse 66 65 62   GENERAL: alert, oriented, appears well and in no acute distress She is coughing nearly constantly when she  is trying to talk, cough sounds dry.  HEENT: atraumatic, conjunttiva clear, no obvious abnormalities on inspection of external nose and ears  NECK: normal movements of the head and neck  LUNGS: on inspection no signs of respiratory distress, breathing rate appears normal, no obvious gross SOB, gasping or wheezing  CV: no obvious cyanosis  MS: moves all visible extremities without noticeable abnormality  PSYCH/NEURO: pleasant and cooperative, no obvious depression or anxiety, speech and thought processing grossly intact  LABS: none today    Chemistry      Component Value Date/Time   NA 138 11/11/2021 0821    K 4.5 11/11/2021 0821   CL 104 11/11/2021 0821   CO2 26 11/11/2021 0821   BUN 10 11/11/2021 0821   CREATININE 0.72 11/11/2021 0821      Component Value Date/Time   CALCIUM 8.8 11/11/2021 0821   ALKPHOS 80 10/01/2020 1131   AST 22 11/11/2021 0821   ALT 19 11/11/2021 0821   BILITOT 0.3 11/11/2021 0821      ASSESSMENT AND PLAN:  Discussed the following assessment and plan:  Prolonged URI with acute bronchitis. Azithromycin x5 days, prednisone 40 mg a day x5 days, and albuterol 2 puffs every 6 hours as needed. Continue Mucinex DM over-the-counter twice daily as needed Continue saline nasal spray.  I discussed the assessment and treatment plan with the patient. The patient was provided an opportunity to ask questions and all were answered. The patient agreed with the plan and demonstrated an understanding of the instructions.   F/u: if not signif improving in 3-4d  Signed:  Santiago Bumpers, MD           12/30/2021

## 2022-05-14 ENCOUNTER — Other Ambulatory Visit: Payer: Self-pay

## 2022-05-15 ENCOUNTER — Ambulatory Visit: Payer: No Typology Code available for payment source | Admitting: Family Medicine

## 2022-05-30 ENCOUNTER — Other Ambulatory Visit: Payer: Self-pay | Admitting: Family Medicine

## 2022-06-02 NOTE — Telephone Encounter (Signed)
Requesting: zolpidem Contract: 10/01/20 UDS: 10/01/20 Last Visit: 12/30/21 Next Visit: no f/u scheduled Last Refill:  11/11/21 (30,5)  Please Advise. Med pending

## 2022-10-01 NOTE — Patient Instructions (Addendum)
Shoulder Impingement Syndrome Rehab Ask your health care provider which exercises are safe for you. Do exercises exactly as told by your provider and adjust them as told. It is normal to feel mild stretching, pulling, tightness, or discomfort as you do these exercises. Stop right away if you feel sudden pain or your pain gets worse. Do not begin these exercises until told by your provider. Stretching and range-of-motion exercise This exercise warms up your muscles and joints and improves the movement and flexibility of your shoulder. This exercise also helps to relieve pain and stiffness. Passive horizontal adduction In passive adduction, you use your other hand to move the injured arm toward your body. The injured arm does not move on its own. In this movement, your arm is moved across your body in the horizontal plane (horizontal adduction). Sit or stand and pull your left / right elbow across your chest, toward your other shoulder. Stop when you feel a gentle stretch in the back of your shoulder and upper arm. Keep your arm at shoulder height. Keep your arm as close to your body as you comfortably can. Hold for __________ seconds. Slowly return to the starting position. Repeat __________ times. Complete this exercise __________ times a day. Strengthening exercises These exercises build strength and endurance in your shoulder. Endurance is the ability to use your muscles for a long time, even after they get tired. External rotation, isometric This is an exercise in which you press the back of your wrist against a doorframe without moving your shoulder joint (isometric). Stand or sit in a doorway, facing the door frame. Bend your left / right elbow and place the back of your wrist against the doorframe. Only the back of your wrist should be touching the frame. Keep your upper arm at your side. Gently press your wrist against the doorframe, as if you are trying to push your arm away from your  abdomen (external rotation). Press as hard as you are able without pain. Avoid shrugging your shoulder while you press your wrist against the doorframe. Keep your shoulder blade tucked down toward the middle of your back. Hold for __________ seconds. Slowly release the tension, and relax your muscles completely before you repeat the exercise. Repeat __________ times. Complete this exercise __________ times a day. Internal rotation, isometric This is an exercise in which you press your palm against a doorframe without moving your shoulder joint (isometric). Stand or sit in a doorway, facing the doorframe. Bend your left / right elbow and place the palm of your hand against the doorframe. Only your palm should be touching the frame. Keep your upper arm at your side. Gently press your hand against the doorframe, as if you are trying to push your arm toward your abdomen (internal rotation). Press as hard as you are able without pain. Avoid shrugging your shoulder while you press your hand against the doorframe. Keep your shoulder blade tucked down toward the middle of your back. Hold for __________ seconds. Slowly release the tension, and relax your muscles completely before you repeat the exercise. Repeat __________ times. Complete this exercise __________ times a day. Scapular protraction, supine, isotonic  Lie on your back on a firm surface (supine position). Hold a __________ weight in your left / right hand. Raise your left / right arm straight into the air so your hand is directly above your shoulder joint. Push the weight into the air so your shoulder (scapula) lifts off the surface that you are lying on.  The scapula will push up or forward (protraction). Do not move your head, neck, or back. Hold for __________ seconds. Slowly return to the starting position. Let your muscles relax completely before you repeat this exercise. Repeat __________ times. Complete this exercise __________ times a  day. Scapular retraction, isotonic  Sit in a stable chair without armrests or stand up. Secure an exercise band to a stable object in front of you so the band is at shoulder height. Hold one end of the exercise band in each hand. Squeeze your shoulder blades together (retraction) and move your elbows slightly behind you. Do not shrug your shoulders upward while you do this. Hold for __________ seconds. Slowly return to the starting position. Repeat __________ times. Complete this exercise __________ times a day. Shoulder extension, isotonic  Sit in a stable chair without armrests or stand up. Secure an exercise band to a stable object in front of you so the band is above shoulder height. Hold one end of the exercise band in each hand. Straighten your elbows and lift your hands up to shoulder height. Squeeze your shoulder blades together and pull your hands down to the sides of your thighs (extension). Stop when your hands are straight down by your sides. Do not let your hands go behind your body. Hold for __________ seconds. Slowly return to the starting position. Repeat __________ times. Complete this exercise __________ times a day. This information is not intended to replace advice given to you by your health care provider. Make sure you discuss any questions you have with your health care provider. Document Revised: 10/24/2021 Document Reviewed: 10/24/2021 Elsevier Patient Education  2024 [employer].

## 2022-10-03 ENCOUNTER — Ambulatory Visit (INDEPENDENT_AMBULATORY_CARE_PROVIDER_SITE_OTHER): Payer: No Typology Code available for payment source | Admitting: Family Medicine

## 2022-10-03 ENCOUNTER — Encounter: Payer: Self-pay | Admitting: Family Medicine

## 2022-10-03 VITALS — BP 114/78 | HR 64 | Temp 98.3°F | Ht 70.0 in | Wt 203.0 lb

## 2022-10-03 DIAGNOSIS — M25512 Pain in left shoulder: Secondary | ICD-10-CM | POA: Diagnosis not present

## 2022-10-03 DIAGNOSIS — Z23 Encounter for immunization: Secondary | ICD-10-CM | POA: Diagnosis not present

## 2022-10-03 DIAGNOSIS — Z79899 Other long term (current) drug therapy: Secondary | ICD-10-CM | POA: Diagnosis not present

## 2022-10-03 DIAGNOSIS — F5104 Psychophysiologic insomnia: Secondary | ICD-10-CM

## 2022-10-03 DIAGNOSIS — M7542 Impingement syndrome of left shoulder: Secondary | ICD-10-CM

## 2022-10-03 MED ORDER — ZOLPIDEM TARTRATE 10 MG PO TABS: ORAL_TABLET | ORAL | 5 refills | Status: DC

## 2022-10-03 NOTE — Progress Notes (Signed)
OFFICE VISIT  10/03/2022  CC:  Chief Complaint  Patient presents with   Medical Management of Chronic Issues    Patient is a 57 y.o. female who presents for follow-up anxiety-related insomnia. A/P as of last visit: " #1 iron deficiency anemia. In the past this has been to menorrhagia. She has not had a menses in 2 years now. Her hemoglobin and iron levels were normal when I saw her back in March this year. At GYN office last month hemoglobin was 9.5. Checking CBC, iron, and Hemoccults today. Continue daily iron tablet.   #2 insomnia. Doing well on Ambien long-term.  10 mg tabs, #30, refill x5."  INTERIM HX: Sleep going well on prn use of ambien.  Has had 2 mo pain in L shoulder.  No preceding trauma or overuse.  Reaching out/away/Abducting is the worst. Hurts when tries to sleep on R shoulder.  Anterolateral aspect is the focus, extends down over deltoid some. No neck pain.   No arm weakness or paresthesias.  PMP AWARE reviewed today: most recent rx for Ambien 10 mg was filled 06/02/2022, # 30, rx by me. No red flags.   Past Medical History:  Diagnosis Date   Abnormal uterine bleeding    10/2019 LAVH/BS converted to TAH. Path ->fibroids,adenomyosis,nl cervix and fallopian tubes.   Anxiety    History of hematuria    History of sexually transmitted disease    trich, gonorrhea, lice   Insomnia    Iron deficiency anemia due to chronic blood loss    Dysf uter blee-->hysterectomy   Menopausal symptom    flushing and vag dryness->HRT 2020 per gyn   Migraine syndrome    S/P total abdominal hysterectomy 11/03/2019   Seasonal allergies    Urinary tract bacterial infections    Vitamin D deficiency    Vit D into normal range only with twice weekly 50K U vit D tab.    Past Surgical History:  Procedure Laterality Date   CESAREAN SECTION  1990   COLONOSCOPY  02/2019   [facility]   CYSTOSCOPY N/A 11/03/2019   Procedure: CYSTOSCOPY;  Surgeon: Sherian Rein, MD;   Location: [facility];  Service: Gynecology;  Laterality: N/A;  Possible   EYE SURGERY     Lasik   LAPAROSCOPIC VAGINAL HYSTERECTOMY WITH SALPINGECTOMY Bilateral 11/03/2019   cervical stump remains. Procedure: LAPAROSCOPIC ASSISTED ATTEMPTED  VAGINAL HYSTERECTOMY WITH SALPINGECTOMY, CONVERTED TO ABDOMINAL HYSTERECTOMY;  Surgeon: Sherian Rein, MD;  Location: [facility];  Service: Gynecology;  Laterality: Bilateral;   TUBAL LIGATION     TYMPANOPLASTY  2010    Outpatient Medications Prior to Visit  Medication Sig Dispense Refill   estradiol (CLIMARA - DOSED IN MG/24 HR) 0.05 mg/24hr patch Place 0.05 mg onto the skin once a week.     estradiol (CLIMARA - DOSED IN MG/24 HR) 0.1 mg/24hr patch Place 0.1 mg onto the skin once a week.     ferrous sulfate 325 (65 FE) MG tablet Take 1 tablet (325 mg total) by mouth daily with breakfast. 60 tablet 1   albuterol (VENTOLIN HFA) 108 (90 Base) MCG/ACT inhaler 2p q6h prn cough or wheeze 8 g 0   azithromycin (ZITHROMAX) 250 MG tablet 2 tabs po qd x 1d, then 1 tab po qd x 4d 6 tablet 0   predniSONE (DELTASONE) 20 MG tablet 2 tabs po qd x 5d 10 tablet 0   zolpidem (AMBIEN) 10 MG tablet TAKE 1 TABLET BY MOUTH EVERY DAY  AT BEDTIME AS NEEDED 30 tablet 1   No facility-administered medications prior to visit.    Allergies  Allergen Reactions   Sulfonamide Derivatives Hives   Penicillins Rash    Review of Systems As per HPI  PE:    10/03/2022    9:03 AM 12/30/2021    3:07 PM 11/11/2021    8:03 AM  Vitals with BMI  Height 5\' 10"  5\' 10"  5\' 10"   Weight 203 lbs  201 lbs  BMI 29.13  28.84  Systolic 114  94  Diastolic 78  65  Pulse 64  66    Physical Exam  Gen: Alert, well appearing.  Patient is oriented to person, place, time, and situation. AFFECT: pleasant, lucid thought and speech. Left shoulder with minimal tenderness to palpation around the acromion.  No tenderness over AC joint or biceps tendon. Range  of motion is intact although very slow and stiff with full supination, limited by pain in anterior shoulder.  Resisted external rotation and resisted internal rotation do not elicit pain.  Negative scarf sign. Positive Hawkins, positive Neer's, positive O'Brien's.  Negative speeds. She can do stop sign position and scapular position fine.   LABS:  Last CBC Lab Results  Component Value Date   WBC 4.8 11/11/2021   HGB 11.8 (L) 11/11/2021   HCT 35.9 (L) 11/11/2021   MCV 85.6 11/11/2021   MCH 29.3 11/04/2019   RDW 13.1 11/11/2021   PLT 229.0 11/11/2021   Lab Results  Component Value Date   IRON 52 11/11/2021   TIBC 256 11/11/2021   FERRITIN 46 11/11/2021    Last metabolic panel Lab Results  Component Value Date   GLUCOSE 84 11/11/2021   NA 138 11/11/2021   K 4.5 11/11/2021   CL 104 11/11/2021   CO2 26 11/11/2021   BUN 10 11/11/2021   CREATININE 0.72 11/11/2021   GFR 99.16 10/01/2020   CALCIUM 8.8 11/11/2021   PHOS 3.2 08/12/2012   PROT 6.9 11/11/2021   ALBUMIN 4.0 10/01/2020   BILITOT 0.3 11/11/2021   ALKPHOS 80 10/01/2020   AST 22 11/11/2021   ALT 19 11/11/2021   ANIONGAP 5 11/04/2019    IMPRESSION AND PLAN:  #1 insomnia, well-controlled/good response to Ambien 10 mg nightly as needed. 1 nightly as needed, #30, refill x 5.  2.  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 Discussed option of formal PT versus home PT/rehab versus steroid injection plus PT. She opted for home physical therapy-rehab exercises reviewed and handout given. She can take ibuprofen or Tylenol as needed severe pain. Recheck in 1 month.  An After Visit Summary was printed and given to the patient.  FOLLOW UP: Return in about 4 weeks (around 10/31/2022) for f/u L shoulder pain. Last CPE was 11/11/2021  Signed:  Santiago Bumpers, MD           10/03/2022

## 2022-10-09 NOTE — Addendum Note (Signed)
Addended by: Filomena Jungling on: 10/09/2022 08:42 AM   Modules accepted: Orders

## 2022-10-14 ENCOUNTER — Encounter: Payer: Self-pay | Admitting: Family Medicine

## 2022-11-07 ENCOUNTER — Ambulatory Visit: Payer: No Typology Code available for payment source | Admitting: Family Medicine

## 2022-11-13 ENCOUNTER — Ambulatory Visit: Payer: No Typology Code available for payment source | Admitting: Family Medicine

## 2022-11-13 ENCOUNTER — Encounter: Payer: Self-pay | Admitting: Family Medicine

## 2022-11-13 VITALS — BP 126/88 | HR 65 | Wt 205.6 lb

## 2022-11-13 DIAGNOSIS — F411 Generalized anxiety disorder: Secondary | ICD-10-CM | POA: Diagnosis not present

## 2022-11-13 DIAGNOSIS — F331 Major depressive disorder, recurrent, moderate: Secondary | ICD-10-CM

## 2022-11-13 DIAGNOSIS — R21 Rash and other nonspecific skin eruption: Secondary | ICD-10-CM | POA: Diagnosis not present

## 2022-11-13 MED ORDER — LORAZEPAM 0.5 MG PO TABS: ORAL_TABLET | ORAL | 0 refills | Status: DC

## 2022-11-13 MED ORDER — DULOXETINE HCL 30 MG PO CPEP: 30.0000 mg | ORAL_CAPSULE | Freq: Every day | ORAL | 0 refills | Status: DC

## 2022-11-13 MED ORDER — METHYLPREDNISOLONE ACETATE 80 MG/ML IJ SUSP
80.0000 mg | Freq: Once | INTRAMUSCULAR | Status: AC
  Administered 2022-11-13: 80 mg via INTRAMUSCULAR

## 2022-11-13 MED ORDER — PREDNISONE 10 MG PO TABS: ORAL_TABLET | ORAL | 0 refills | Status: DC

## 2022-11-13 NOTE — Progress Notes (Signed)
OFFICE VISIT  11/13/2022  CC:  Chief Complaint  Patient presents with   Leg Concern    Pt has bumps on both legs, appeared about a month ago. Pt states bumps cause lots of irritation. Pt has tried benadryl, both the cream and tablets.     Patient is a 57 y.o. female who presents for rash.  HPI: A few weeks ago she developed a few little red bumps on lower legs, very itchy.  She thought flea bit her because she had been cleaning out a rental property. Gradually more more lesions developed and a few have faded. She now has 1 lesion on her arm and says 1 lesions on the upper abdomen.  Has tried Benadryl but does not help much. No recent known contact irritants or allergens.  No new medications.  No new foods.  Colleen Ruiz is also undergoing significant depressed mood and anxiety over the last year.  Building up worse over the last couple of months.  She has crying spells, guilt, anhedonia, feels overwhelmed with negative thoughts.  Sleep dysfunction.  Denies SI or HI.  Review of systems: No recent viral syndrome, no headaches, no cough, no swelling of the lips, tongue, or eyes. No hives.  Past Medical History:  Diagnosis Date   Abnormal uterine bleeding    10/2019 LAVH/BS converted to TAH. Path ->fibroids,adenomyosis,nl cervix and fallopian tubes.   Anxiety    History of hematuria    History of sexually transmitted disease    trich, gonorrhea, lice   Insomnia    Iron deficiency anemia due to chronic blood loss    Dysf uter blee-->hysterectomy   Menopausal symptom    flushing and vag dryness->HRT 2020 per gyn   Migraine syndrome    S/P total abdominal hysterectomy 11/03/2019   Seasonal allergies    Urinary tract bacterial infections    Vitamin D deficiency    Vit D into normal range only with twice weekly 50K U vit D tab.    Past Surgical History:  Procedure Laterality Date   CESAREAN SECTION  1990   COLONOSCOPY  02/2019   [facility]   CYSTOSCOPY N/A 11/03/2019   Procedure:  CYSTOSCOPY;  Surgeon: Sherian Rein, MD;  Location: [facility];  Service: Gynecology;  Laterality: N/A;  Possible   EYE SURGERY     Lasik   LAPAROSCOPIC VAGINAL HYSTERECTOMY WITH SALPINGECTOMY Bilateral 11/03/2019   cervical stump remains. Procedure: LAPAROSCOPIC ASSISTED ATTEMPTED  VAGINAL HYSTERECTOMY WITH SALPINGECTOMY, CONVERTED TO ABDOMINAL HYSTERECTOMY;  Surgeon: Sherian Rein, MD;  Location: [facility];  Service: Gynecology;  Laterality: Bilateral;   TUBAL LIGATION     TYMPANOPLASTY  2010    Outpatient Medications Prior to Visit  Medication Sig Dispense Refill   estradiol (CLIMARA - DOSED IN MG/24 HR) 0.05 mg/24hr patch Place 0.05 mg onto the skin once a week.     estradiol (CLIMARA - DOSED IN MG/24 HR) 0.1 mg/24hr patch Place 0.1 mg onto the skin once a week.     ferrous sulfate 325 (65 FE) MG tablet Take 1 tablet (325 mg total) by mouth daily with breakfast. 60 tablet 1   zolpidem (AMBIEN) 10 MG tablet TAKE 1 TABLET BY MOUTH EVERY DAY AT BEDTIME AS NEEDED 30 tablet 5   No facility-administered medications prior to visit.    Allergies  Allergen Reactions   Sulfonamide Derivatives Hives   Penicillins Rash    Review of Systems  As per HPI  PE:  11/13/2022    3:55 PM 10/03/2022    9:03 AM 12/30/2021    3:07 PM  Vitals with BMI  Height  5\' 10"  5\' 10"   Weight 205 lbs 10 oz 203 lbs   BMI  29.13   Systolic 126 114   Diastolic 88 78   Pulse 65 64      Physical Exam  Gen: Alert, well appearing.  Patient is oriented to person, place, time, and situation. AFFECT: pleasant, lucid thought and speech. She did start crying during our encounter today, talking about her depression. Skin: Lower legs are scattered with small pinkish macules and papules.  Mostly on anterior surfaces.  These blanch.  There are several areas of hypopigmentation at the site of previous lesions. No ecchymoses or petechiae.  No vesicles, no pustules, no  nodules. There is no tenderness to palpation.  She has 1 similar lesion on the right upper arm.  LABS:  Last CBC Lab Results  Component Value Date   WBC 4.8 11/11/2021   HGB 11.8 (L) 11/11/2021   HCT 35.9 (L) 11/11/2021   MCV 85.6 11/11/2021   MCH 29.3 11/04/2019   RDW 13.1 11/11/2021   PLT 229.0 11/11/2021   Lab Results  Component Value Date   IRON 52 11/11/2021   TIBC 256 11/11/2021   FERRITIN 46 11/11/2021   Last metabolic panel Lab Results  Component Value Date   GLUCOSE 84 11/11/2021   NA 138 11/11/2021   K 4.5 11/11/2021   CL 104 11/11/2021   CO2 26 11/11/2021   BUN 10 11/11/2021   CREATININE 0.72 11/11/2021   GFR 99.16 10/01/2020   CALCIUM 8.8 11/11/2021   PHOS 3.2 08/12/2012   PROT 6.9 11/11/2021   ALBUMIN 4.0 10/01/2020   BILITOT 0.3 11/11/2021   ALKPHOS 80 10/01/2020   AST 22 11/11/2021   ALT 19 11/11/2021   ANIONGAP 5 11/04/2019   Last hemoglobin A1c Lab Results  Component Value Date   HGBA1C 5.4 08/15/2014   Last thyroid functions Lab Results  Component Value Date   TSH 1.93 11/11/2021   Last vitamin B12 and Folate Lab Results  Component Value Date   VITAMINB12 450 10/27/2016   IMPRESSION AND PLAN:  #1 pruritic rash of unknown etiology. Depo-Medrol 80 mg IM today. Tomorrow start prednisone 30 daily x 3 days then 20 daily x 3 days, then 10 daily x 3 days. Over-the-counter nonsedating antihistamine recommended.  2.  Recurrent major depressive disorder, active, mild to moderate episode. She does have significant generalized anxiety as well. In review of my notes it appears that citalopram and paroxetine were tried in the remote past (7 to 8 years ago) and were not helpful.  I then had her on lorazepam 2 mg daily as needed and this was helpful but we stopped this year to back because she had been doing well. I recommended we try duloxetine 30 mg daily.  Therapeutic expectations and side effect profile of medication discussed today.  Patient's  questions answered. Additionally, lorazepam 0.5 mg prescribed.  1-2 daily as needed. She will be seeking out counseling through her employer health program.    An After Visit Summary was printed and given to the patient.  FOLLOW UP: Return for 3-4 wks f/u anx/dep.  Signed:  Santiago Bumpers, MD           11/13/2022

## 2022-11-28 ENCOUNTER — Ambulatory Visit: Payer: No Typology Code available for payment source | Admitting: Family Medicine

## 2022-12-02 ENCOUNTER — Other Ambulatory Visit: Payer: Self-pay | Admitting: Family Medicine

## 2022-12-03 NOTE — Telephone Encounter (Signed)
No further action needed.

## 2022-12-03 NOTE — Telephone Encounter (Signed)
Requesting: LORazepam (ATIVAN) 0.5 MG tablet  Contract: 10/03/22 UDS: 10/01/20 Last Visit: 11/13/22 Next Visit: 12/08/22 Last Refill: 11/13/22 (30,0)  Please Advise. Med pending

## 2022-12-08 ENCOUNTER — Encounter: Payer: Self-pay | Admitting: Family Medicine

## 2022-12-08 ENCOUNTER — Ambulatory Visit (INDEPENDENT_AMBULATORY_CARE_PROVIDER_SITE_OTHER): Payer: No Typology Code available for payment source | Admitting: Family Medicine

## 2022-12-08 VITALS — BP 111/77 | HR 66 | Wt 205.0 lb

## 2022-12-08 DIAGNOSIS — F5104 Psychophysiologic insomnia: Secondary | ICD-10-CM

## 2022-12-08 DIAGNOSIS — F3341 Major depressive disorder, recurrent, in partial remission: Secondary | ICD-10-CM

## 2022-12-08 DIAGNOSIS — F411 Generalized anxiety disorder: Secondary | ICD-10-CM | POA: Diagnosis not present

## 2022-12-08 DIAGNOSIS — Z79899 Other long term (current) drug therapy: Secondary | ICD-10-CM | POA: Diagnosis not present

## 2022-12-08 MED ORDER — DULOXETINE HCL 30 MG PO CPEP: 30.0000 mg | ORAL_CAPSULE | Freq: Every day | ORAL | 2 refills | Status: DC

## 2022-12-08 MED ORDER — LORAZEPAM 0.5 MG PO TABS: ORAL_TABLET | ORAL | 1 refills | Status: DC

## 2022-12-08 NOTE — Progress Notes (Signed)
OFFICE VISIT  12/08/2022  CC:  Chief Complaint  Patient presents with   Anxiety    F/U.    Depression    F/U.     Patient is a 57 y.o. female who presents for 3-week follow-up rash, anxiety, and depression. A/P as of last visit: "#1 pruritic rash of unknown etiology. Depo-Medrol 80 mg IM today. Tomorrow start prednisone 30 daily x 3 days then 20 daily x 3 days, then 10 daily x 3 days. Over-the-counter nonsedating antihistamine recommended.   2.  Recurrent major depressive disorder, active, mild to moderate episode. She does have significant generalized anxiety as well. In review of my notes it appears that citalopram and paroxetine were tried in the remote past (7 to 8 years ago) and were not helpful.  I then had her on lorazepam 2 mg daily as needed and this was helpful but we stopped this year to back because she had been doing well. I recommended we try duloxetine 30 mg daily.  Therapeutic expectations and side effect profile of medication discussed today.  Patient's questions answered. Additionally, lorazepam 0.5 mg prescribed.  1-2 daily as needed. She will be seeking out counseling through her employer health program."  INTERIM HX: Colleen Ruiz is doing much better.  Less crying spells.  Less periods of feeling overwhelmed.  She takes the lorazepam most days in the morning and the evening.  Occasionally still takes Ambien in the evening if she cannot sleep.  No SI or HI.  Still a bit of anhedonia.  She is talking to a counselor once a week.  PMP AWARE reviewed today: most recent rx for lorazepam was filled 12/03/2022, # 30, rx by me. Most recent prescription for zolpidem was filled 11/11/2022, #30, prescription by me. No red flags.  Past Medical History:  Diagnosis Date   Abnormal uterine bleeding    10/2019 LAVH/BS converted to TAH. Path ->fibroids,adenomyosis,nl cervix and fallopian tubes.   Anxiety    History of hematuria    History of sexually transmitted disease     trich, gonorrhea, lice   Insomnia    Iron deficiency anemia due to chronic blood loss    Dysf uter blee-->hysterectomy   Menopausal symptom    flushing and vag dryness->HRT 2020 per gyn   Migraine syndrome    S/P total abdominal hysterectomy 11/03/2019   Seasonal allergies    Urinary tract bacterial infections    Vitamin D deficiency    Vit D into normal range only with twice weekly 50K U vit D tab.    Past Surgical History:  Procedure Laterality Date   CESAREAN SECTION  1990   COLONOSCOPY  02/2019   [facility]   CYSTOSCOPY N/A 11/03/2019   Procedure: CYSTOSCOPY;  Surgeon: Sherian Rein, MD;  Location: [facility];  Service: Gynecology;  Laterality: N/A;  Possible   EYE SURGERY     Lasik   LAPAROSCOPIC VAGINAL HYSTERECTOMY WITH SALPINGECTOMY Bilateral 11/03/2019   cervical stump remains. Procedure: LAPAROSCOPIC ASSISTED ATTEMPTED  VAGINAL HYSTERECTOMY WITH SALPINGECTOMY, CONVERTED TO ABDOMINAL HYSTERECTOMY;  Surgeon: Sherian Rein, MD;  Location: [facility];  Service: Gynecology;  Laterality: Bilateral;   TUBAL LIGATION     TYMPANOPLASTY  2010    Outpatient Medications Prior to Visit  Medication Sig Dispense Refill   estradiol (CLIMARA - DOSED IN MG/24 HR) 0.05 mg/24hr patch Place 0.05 mg onto the skin once a week.     estradiol (CLIMARA - DOSED IN MG/24 HR) 0.1 mg/24hr patch  Place 0.1 mg onto the skin once a week.     ferrous sulfate 325 (65 FE) MG tablet Take 1 tablet (325 mg total) by mouth daily with breakfast. 60 tablet 1   zolpidem (AMBIEN) 10 MG tablet TAKE 1 TABLET BY MOUTH EVERY DAY AT BEDTIME AS NEEDED 30 tablet 5   DULoxetine (CYMBALTA) 30 MG capsule Take 1 capsule (30 mg total) by mouth daily. 30 capsule 0   LORazepam (ATIVAN) 0.5 MG tablet TAKE 1 TO 2 TABLETS BY MOUTH ONCE DAILY AS NEEDED FOR ANXIETY 30 tablet 1   predniSONE (DELTASONE) 10 MG tablet 3 every day x 3d then 2 every day x 3d then 1 every day x 3d (Patient  not taking: Reported on 12/08/2022) 18 tablet 0   No facility-administered medications prior to visit.    Allergies  Allergen Reactions   Sulfonamide Derivatives Hives   Penicillins Rash    Review of Systems As per HPI  PE:    12/08/2022    8:38 AM 11/13/2022    3:55 PM 10/03/2022    9:03 AM  Vitals with BMI  Height   5\' 10"   Weight 205 lbs 205 lbs 10 oz 203 lbs  BMI   29.13  Systolic 111 126 161  Diastolic 77 88 78  Pulse 66 65 64     Physical Exam  Gen: Alert, well appearing.  Patient is oriented to person, place, time, and situation. AFFECT: pleasant, lucid thought and speech. No further exam today.  LABS:  Last CBC Lab Results  Component Value Date   WBC 4.8 11/11/2021   HGB 11.8 (L) 11/11/2021   HCT 35.9 (L) 11/11/2021   MCV 85.6 11/11/2021   MCH 29.3 11/04/2019   RDW 13.1 11/11/2021   PLT 229.0 11/11/2021   Last metabolic panel Lab Results  Component Value Date   GLUCOSE 84 11/11/2021   NA 138 11/11/2021   K 4.5 11/11/2021   CL 104 11/11/2021   CO2 26 11/11/2021   BUN 10 11/11/2021   CREATININE 0.72 11/11/2021   GFR 99.16 10/01/2020   CALCIUM 8.8 11/11/2021   PHOS 3.2 08/12/2012   PROT 6.9 11/11/2021   ALBUMIN 4.0 10/01/2020   BILITOT 0.3 11/11/2021   ALKPHOS 80 10/01/2020   AST 22 11/11/2021   ALT 19 11/11/2021   ANIONGAP 5 11/04/2019   IMPRESSION AND PLAN:  Recurrent major depressive disorder, active, mild to moderate episode. She does have significant generalized anxiety as well. She is improving on duloxetine 30 mg a day and lorazepam 0.5 mg twice daily.  Continue current regimen. Urine drug screen today.  An After Visit Summary was printed and given to the patient.  FOLLOW UP: Return in about 2 months (around 02/07/2023) for annual CPE (fasting). Cpe due Signed:  Santiago Bumpers, MD           12/08/2022

## 2022-12-11 LAB — DM TEMPLATE

## 2022-12-11 LAB — DRUG MONITORING PANEL 376104, URINE
Alphahydroxyalprazolam: NEGATIVE ng/mL (ref <25)
Alphahydroxymidazolam: NEGATIVE ng/mL (ref <50)
Alphahydroxytriazolam: NEGATIVE ng/mL (ref <50)
Aminoclonazepam: NEGATIVE ng/mL (ref <25)
Amphetamines: NEGATIVE ng/mL (ref <500)
Barbiturates: NEGATIVE ng/mL (ref <300)
Benzodiazepines: POSITIVE ng/mL — AB (ref <100)
Cocaine Metabolite: NEGATIVE ng/mL (ref <150)
Desmethyltramadol: NEGATIVE ng/mL (ref <100)
Hydroxyethylflurazepam: NEGATIVE ng/mL (ref <50)
Lorazepam: 355 ng/mL — ABNORMAL HIGH (ref <50)
Nordiazepam: NEGATIVE ng/mL (ref <50)
Opiates: NEGATIVE ng/mL (ref <100)
Oxazepam: NEGATIVE ng/mL (ref <50)
Oxycodone: NEGATIVE ng/mL (ref <100)
Temazepam: NEGATIVE ng/mL (ref <50)
Tramadol: NEGATIVE ng/mL (ref <100)

## 2022-12-15 ENCOUNTER — Telehealth: Payer: Self-pay | Admitting: Family Medicine

## 2022-12-15 NOTE — Telephone Encounter (Signed)
Patient called to inquire about lab results. She is concerned about the abnormal results. Please give the patient a call to discuss.

## 2022-12-15 NOTE — Telephone Encounter (Signed)
Spoke to pt and explained results.

## 2023-01-12 ENCOUNTER — Other Ambulatory Visit: Payer: Self-pay | Admitting: Family Medicine

## 2023-02-11 NOTE — Progress Notes (Signed)
 Office Note 02/12/2023  CC:  Chief Complaint  Patient presents with   Annual Exam    Pt fasting.    Patient is a 58 y.o. female who is here for annual health maintenance exam and 2 mo f/u depression. A/P as of last visit: Recurrent major depressive disorder, active, mild to moderate episode. She does have significant generalized anxiety as well. She is improving on duloxetine  30 mg a day and lorazepam  0.5 mg twice daily.  INTERIM HX: Anxiety is better, although she does need the alprazolam at least once a day and sometimes twice.  Recently she has noted she has to take 2 at a time sometimes in order to get a decent amount of anxiety reduction. She feels like her mood continues to be stable. She has been talking to a counselor regularly lately. Sleep is fine.  PMP AWARE reviewed today: most recent rx for lorazepam  0.5 was filled 02/08/2023, # 60, rx by me.  Most recent Ambien  10 mg prescription filled 01/16/2023, #30, prescription by me. No red flags.  Past Medical History:  Diagnosis Date   Abnormal uterine bleeding    10/2019 LAVH/BS converted to TAH. Path ->fibroids,adenomyosis,nl cervix and fallopian tubes.   Anxiety    History of hematuria    History of sexually transmitted disease    trich, gonorrhea, lice   Insomnia    Iron deficiency anemia due to chronic blood loss    Dysf uter blee-->hysterectomy   Menopausal symptom    flushing and vag dryness->HRT 2020 per gyn   Migraine syndrome    S/P total abdominal hysterectomy 11/03/2019   Seasonal allergies    Urinary tract bacterial infections    Vitamin D  deficiency    Vit D into normal range only with twice weekly 50K U vit D tab.    Past Surgical History:  Procedure Laterality Date   CESAREAN SECTION  1990   COLONOSCOPY  02/2019   [facility]   CYSTOSCOPY N/A 11/03/2019   Procedure: CYSTOSCOPY;  Surgeon: Danielle Rom, MD;  Location: [facility];  Service: Gynecology;  Laterality: N/A;   Possible   EYE SURGERY     Lasik   LAPAROSCOPIC VAGINAL HYSTERECTOMY WITH SALPINGECTOMY Bilateral 11/03/2019   cervical stump remains. Procedure: LAPAROSCOPIC ASSISTED ATTEMPTED  VAGINAL HYSTERECTOMY WITH SALPINGECTOMY, CONVERTED TO ABDOMINAL HYSTERECTOMY;  Surgeon: Danielle Rom, MD;  Location: [facility];  Service: Gynecology;  Laterality: Bilateral;   TUBAL LIGATION     TYMPANOPLASTY  2010    Family History  Problem Relation Age of Onset   Hypertension Mother    Dementia Mother    Heart disease Father    Cancer Father    Breast cancer Paternal Aunt        46's     Social History   Socioeconomic History   Marital status: Married    Spouse name: Not on file   Number of children: 2   Years of education: Not on file   Highest education level: Some college, no degree  Occupational History    Employer: [employer]  Tobacco Use   Smoking status: Never   Smokeless tobacco: Never  Vaping Use   Vaping status: Never Used  Substance and Sexual Activity   Alcohol use: No   Drug use: No   Sexual activity: Yes    Birth control/protection: Surgical  Other Topics Concern   Not on file  Social History Narrative   Ms Haughn lives with her husband, works for  [employer], and has two grown children. Her son serves in the [employer] and is currently serving in [place].  She will be going to [place] 04/2014 to visit him.  Her daughter studied psychology at [employer] and has returned for graduate studies.    No T/A/Ds.   Social Drivers of Corporate Investment Banker Strain: Low Risk  (02/11/2023)   Overall Financial Resource Strain (CARDIA)    Difficulty of Paying Living Expenses: Not hard at all  Food Insecurity: No Food Insecurity (02/11/2023)   Hunger Vital Sign    Worried About Running Out of Food in the Last Year: Never true    Ran Out of Food in the Last Year: Never true  Transportation Needs: No Transportation Needs (02/11/2023)   PRAPARE -  Administrator, Civil Service (Medical): No    Lack of Transportation (Non-Medical): No  Physical Activity: Insufficiently Active (02/11/2023)   Exercise Vital Sign    Days of Exercise per Week: 1 day    Minutes of Exercise per Session: 10 min  Stress: Stress Concern Present (02/11/2023)   [employer] of Occupational Health - Occupational Stress Questionnaire    Feeling of Stress : Rather much  Social Connections: Socially Integrated (02/11/2023)   Social Connection and Isolation Panel [NHANES]    Frequency of Communication with Friends and Family: More than three times a week    Frequency of Social Gatherings with Friends and Family: Three times a week    Attends Religious Services: More than 4 times per year    Active Member of Clubs or Organizations: Yes    Attends Banker Meetings: More than 4 times per year    Marital Status: Married  Catering Manager Violence: Unknown (05/15/2021)   Received from [facility], [facility]   HITS    Physically Hurt: Not on file    Insult or Talk Down To: Not on file    Threaten Physical Harm: Not on file    Scream or Curse: Not on file    Outpatient Medications Prior to Visit  Medication Sig Dispense Refill   estradiol (CLIMARA - DOSED IN MG/24 HR) 0.05 mg/24hr patch Place 0.05 mg onto the skin once a week.     estradiol (CLIMARA - DOSED IN MG/24 HR) 0.1 mg/24hr patch Place 0.1 mg onto the skin once a week.     ferrous sulfate  325 (65 FE) MG tablet Take 1 tablet (325 mg total) by mouth daily with breakfast. 60 tablet 1   zolpidem  (AMBIEN ) 10 MG tablet TAKE 1 TABLET BY MOUTH EVERY DAY AT BEDTIME AS NEEDED 30 tablet 5   LORazepam  (ATIVAN ) 0.5 MG tablet 1 tab po bid prn 60 tablet 1   DULoxetine  (CYMBALTA ) 30 MG capsule Take 1 capsule (30 mg total) by mouth daily. 30 capsule 2   No facility-administered medications prior to visit.    Allergies  Allergen Reactions   Sulfonamide Derivatives Hives   Penicillins Rash     Review of Systems  Constitutional:  Negative for appetite change, chills, fatigue and fever.  HENT:  Negative for congestion, dental problem, ear pain and sore throat.   Eyes:  Negative for discharge, redness and visual disturbance.  Respiratory:  Negative for cough, chest tightness, shortness of breath and wheezing.   Cardiovascular:  Negative for chest pain, palpitations and leg swelling.  Gastrointestinal:  Negative for abdominal pain, blood in stool, diarrhea, nausea and vomiting.  Genitourinary:  Negative for difficulty urinating, dysuria,  flank pain, frequency, hematuria and urgency.  Musculoskeletal:  Negative for arthralgias, back pain, joint swelling, myalgias and neck stiffness.  Skin:  Negative for pallor and rash.  Neurological:  Negative for dizziness, speech difficulty, weakness and headaches.  Hematological:  Negative for adenopathy. Does not bruise/bleed easily.  Psychiatric/Behavioral:  Negative for confusion and sleep disturbance. The patient is nervous/anxious.     PE;    02/12/2023    8:01 AM 12/08/2022    8:38 AM 11/13/2022    3:55 PM  Vitals with BMI  Height 5' 10.5    Weight 203 lbs 205 lbs 205 lbs 10 oz  BMI 28.71    Systolic 105 111 873  Diastolic 73 77 88  Pulse 63 66 65   Exam chaperoned by Cloe Motsinger, CMA Gen: Alert, well appearing.  Patient is oriented to person, place, time, and situation. AFFECT: pleasant, lucid thought and speech. ENT: Ears: EACs clear, normal epithelium.  TMs with good light reflex and landmarks bilaterally.  Eyes: no injection, icteris, swelling, or exudate.  EOMI, PERRLA. Nose: no drainage or turbinate edema/swelling.  No injection or focal lesion.  Mouth: lips without lesion/swelling.  Oral mucosa pink and moist.  Dentition intact and without obvious caries or gingival swelling.  Oropharynx without erythema, exudate, or swelling.  Neck: supple/nontender.  No LAD, mass, or TM.  Carotid pulses 2+ bilaterally, without  bruits. CV: RRR, no m/r/g.   LUNGS: CTA bilat, nonlabored resps, good aeration in all lung fields. ABD: soft, NT, ND, BS normal.  No hepatospenomegaly or mass.  No bruits. EXT: no clubbing or cyanosis.  She has 1-2+ bilateral lower extremity pitting edema. Musculoskeletal: no joint swelling, erythema, warmth, or tenderness.  ROM of all joints intact. Skin - no sores or suspicious lesions or rashes or color changes  Pertinent labs:  Lab Results  Component Value Date   TSH 1.93 11/11/2021   Lab Results  Component Value Date   WBC 4.8 11/11/2021   HGB 11.8 (L) 11/11/2021   HCT 35.9 (L) 11/11/2021   MCV 85.6 11/11/2021   PLT 229.0 11/11/2021   Lab Results  Component Value Date   IRON 52 11/11/2021   TIBC 256 11/11/2021   FERRITIN 46 11/11/2021   Lab Results  Component Value Date   VITAMINB12 450 10/27/2016   Lab Results  Component Value Date   CREATININE 0.72 11/11/2021   BUN 10 11/11/2021   NA 138 11/11/2021   K 4.5 11/11/2021   CL 104 11/11/2021   CO2 26 11/11/2021   Lab Results  Component Value Date   ALT 19 11/11/2021   AST 22 11/11/2021   ALKPHOS 80 10/01/2020   BILITOT 0.3 11/11/2021   Lab Results  Component Value Date   CHOL 185 11/11/2021   Lab Results  Component Value Date   HDL 59 11/11/2021   Lab Results  Component Value Date   LDLCALC 108 (H) 11/11/2021   Lab Results  Component Value Date   TRIG 86 11/11/2021   Lab Results  Component Value Date   CHOLHDL 3.1 11/11/2021   Lab Results  Component Value Date   HGBA1C 5.4 08/15/2014   Last vitamin D  Lab Results  Component Value Date   VD25OH 38.36 07/02/2017   ASSESSMENT AND PLAN:   #1 health maintenance exam: Reviewed age and gender appropriate health maintenance issues (prudent diet, regular exercise, health risks of tobacco and excessive alcohol, use of seatbelts, fire alarms in home, use of sunscreen).  Also reviewed  age and gender appropriate health screening as well as vaccine  recommendations. Vaccines: Shingirx->deferred.  Flu->UTD. Labs: fasting HP, iron panel. Cervical ca screening: per GYN MD. Breast ca screening: per GYN MD.  11/2021 abnl mammo-->diagnostic showed fibrocystic changes.   She has this scheduled for late this month Colon ca screening: recall 2031  #2 major depressive disorder, in partial remission. Continue 30 mg duloxetine  daily.  3.  GAD with superimposed situational anxiety. Increased lorazepam  to 1 mg tabs today, 1 twice daily as needed, #60, refill x 2.  #4 normocytic anemia. History of iron deficiency due to abnormal uterine bleeding. She is pretty compliant with daily iron tab. Iron panel and B12 level today.  An After Visit Summary was printed and given to the patient.  FOLLOW UP:  Return in about 3 months (around 05/13/2023) for routine chronic illness f/u.  Signed:  Gerlene Hockey, MD           02/12/2023

## 2023-02-12 ENCOUNTER — Ambulatory Visit (INDEPENDENT_AMBULATORY_CARE_PROVIDER_SITE_OTHER): Payer: No Typology Code available for payment source | Admitting: Family Medicine

## 2023-02-12 ENCOUNTER — Encounter: Payer: Self-pay | Admitting: Family Medicine

## 2023-02-12 VITALS — BP 105/73 | HR 63 | Ht 70.5 in | Wt 203.0 lb

## 2023-02-12 DIAGNOSIS — D649 Anemia, unspecified: Secondary | ICD-10-CM | POA: Diagnosis not present

## 2023-02-12 DIAGNOSIS — F411 Generalized anxiety disorder: Secondary | ICD-10-CM | POA: Diagnosis not present

## 2023-02-12 DIAGNOSIS — F418 Other specified anxiety disorders: Secondary | ICD-10-CM | POA: Diagnosis not present

## 2023-02-12 DIAGNOSIS — E559 Vitamin D deficiency, unspecified: Secondary | ICD-10-CM

## 2023-02-12 DIAGNOSIS — Z0000 Encounter for general adult medical examination without abnormal findings: Secondary | ICD-10-CM | POA: Diagnosis not present

## 2023-02-12 DIAGNOSIS — F3340 Major depressive disorder, recurrent, in remission, unspecified: Secondary | ICD-10-CM

## 2023-02-12 LAB — CBC
HCT: 36.7 % (ref 36.0–46.0)
Hemoglobin: 12 g/dL (ref 12.0–15.0)
MCHC: 32.6 g/dL (ref 30.0–36.0)
MCV: 87.4 fL (ref 78.0–100.0)
Platelets: 251 10*3/uL (ref 150.0–400.0)
RBC: 4.2 Mil/uL (ref 3.87–5.11)
RDW: 13.3 % (ref 11.5–15.5)
WBC: 5.3 10*3/uL (ref 4.0–10.5)

## 2023-02-12 LAB — VITAMIN B12: Vitamin B-12: 144 pg/mL — ABNORMAL LOW (ref 211–911)

## 2023-02-12 LAB — COMPREHENSIVE METABOLIC PANEL
ALT: 12 U/L (ref 0–35)
AST: 16 U/L (ref 0–37)
Albumin: 4.1 g/dL (ref 3.5–5.2)
Alkaline Phosphatase: 80 U/L (ref 39–117)
BUN: 11 mg/dL (ref 6–23)
CO2: 28 meq/L (ref 19–32)
Calcium: 9 mg/dL (ref 8.4–10.5)
Chloride: 104 meq/L (ref 96–112)
Creatinine, Ser: 0.7 mg/dL (ref 0.40–1.20)
GFR: 96.15 mL/min (ref >60.00)
Glucose, Bld: 89 mg/dL (ref 70–99)
Potassium: 4 meq/L (ref 3.5–5.1)
Sodium: 139 meq/L (ref 135–145)
Total Bilirubin: 0.5 mg/dL (ref 0.2–1.2)
Total Protein: 6.9 g/dL (ref 6.0–8.3)

## 2023-02-12 LAB — LIPID PANEL
Cholesterol: 214 mg/dL — ABNORMAL HIGH (ref 0–200)
HDL: 66.6 mg/dL (ref >39.00)
LDL Cholesterol: 136 mg/dL — ABNORMAL HIGH (ref 0–99)
NonHDL: 147.65
Total CHOL/HDL Ratio: 3
Triglycerides: 58 mg/dL (ref 0.0–149.0)
VLDL: 11.6 mg/dL (ref 0.0–40.0)

## 2023-02-12 LAB — VITAMIN D 25 HYDROXY (VIT D DEFICIENCY, FRACTURES): VITD: 23.3 ng/mL — ABNORMAL LOW (ref 30.00–100.00)

## 2023-02-12 MED ORDER — DULOXETINE HCL 30 MG PO CPEP: 30.0000 mg | ORAL_CAPSULE | Freq: Every day | ORAL | 3 refills | Status: DC

## 2023-02-12 MED ORDER — LORAZEPAM 1 MG PO TABS: 1.0000 mg | ORAL_TABLET | Freq: Two times a day (BID) | ORAL | 2 refills | Status: DC | PRN

## 2023-02-12 NOTE — Patient Instructions (Signed)

## 2023-02-13 ENCOUNTER — Encounter: Payer: Self-pay | Admitting: Family Medicine

## 2023-02-13 LAB — IRON,TIBC AND FERRITIN PANEL
%SAT: 33 % (ref 16–45)
Ferritin: 62 ng/mL (ref 16–232)
Iron: 81 ug/dL (ref 45–160)
TIBC: 249 ug/dL — ABNORMAL LOW (ref 250–450)

## 2023-04-13 ENCOUNTER — Ambulatory Visit: Payer: Self-pay | Admitting: Family Medicine

## 2023-04-13 NOTE — Telephone Encounter (Signed)
 Copied from CRM [id number]. Topic: Clinical - Red Word Triage >> Apr 13, 2023  3:14 PM Sim Boast F wrote: Red Word that prompted transfer to Nurse Triage: Patient feeling numbness on left leg from the knee down     Chief Complaint: Leg numbness Symptoms: Left leg numbness Pertinent Negatives: Patient denies difficulty breathing Disposition: [] ED /[] Urgent Care (no appt availability in office) / [x] Appointment(In office/virtual)/ []  [place] [facility]/ [] Home Care/ [] Refused Recommended Disposition /[] [place] [facility]/ []  Follow-up with PCP Additional Notes: Patient stated that she started having pain in her left leg 3 weeks ago. The pain resolved but then she began having intermittent numbness and tinging. Offered to schedule appt tomorrow. Patient declined and requested appointment on 3/5. Appointment scheduled.   Reason for Disposition  [1] Numbness or tingling on both sides of body AND [2] is a new symptom present > 24 hours    On one side*, left leg  Answer Assessment - Initial Assessment Questions 1. SYMPTOM: "What is the main symptom you are concerned about?" (e.g., weakness, numbness)     Numbness and tingling in left leg, from knee to right above the ankle  2. ONSET: "When did this start?" (minutes, hours, days; while sleeping)      1 week ago. Patient had pain in the same leg 3 weeks ago and the pain resolved  3. LAST NORMAL: "When was the last time you (the patient) were normal  4. PATTERN "Does this come and go, or has it been constant since it started?"  "Is it present now?"     Comes and goes  5. CARDIAC SYMPTOMS: "Have you had any of the following symptoms: chest pain, difficulty breathing, palpitations?"     No  6. OTHER SYMPTOMS: "Do you have any other symptoms?"     There was leg pain in the beginning then it subsided. Mild swelling on left lower leg  Protocols used: Neurologic Deficit-A-AH

## 2023-04-13 NOTE — Telephone Encounter (Signed)
 FYI noted.

## 2023-04-14 NOTE — Progress Notes (Unsigned)
 Colleen Ruiz , 07/31/1965, 58 y.o., female MRN: [id number] Patient Care Team    Relationship Specialty Notifications Start End  McGowen, Maryjean Morn, MD PCP - General Family Medicine  03/02/15   Sherian Rein, MD Consulting Physician Obstetrics and Gynecology  04/27/17     No chief complaint on file.    Subjective: Colleen Ruiz is a 58 y.o. Pt presents for an OV with complaints of *** of *** duration.  Associated symptoms include ***.  Pt has tried *** to ease their symptoms.      02/12/2023    8:03 AM 12/08/2022    8:42 AM 11/13/2022    3:57 PM 10/03/2022    9:08 AM 05/06/2021    9:42 AM  Depression screen PHQ 2/9  Decreased Interest 2 1 0 0 0  Down, Depressed, Hopeless 2 1 1  0 0  PHQ - 2 Score 4 2 1  0 0  Altered sleeping 3 3 3     Tired, decreased energy 2  0    Change in appetite 1 1 1     Feeling bad or failure about yourself  2  1    Trouble concentrating 1 1 0    Moving slowly or fidgety/restless 2  0    Suicidal thoughts 0  0    PHQ-9 Score 15 7 6     Difficult doing work/chores Somewhat difficult  Not difficult at all      Allergies  Allergen Reactions   Sulfonamide Derivatives Hives   Penicillins Rash   Social History   Social History Narrative   Colleen Ruiz lives with her husband, works for [employer], and has two grown children. Her son serves in the [employer] and is currently serving in [place].  She will be going to [place] 04/2014 to visit him.  Her daughter studied psychology at [employer] and has returned for graduate studies.    No T/A/Ds.   Past Medical History:  Diagnosis Date   Abnormal uterine bleeding    10/2019 LAVH/BS converted to TAH. Path ->fibroids,adenomyosis,nl cervix and fallopian tubes.   Anxiety    History of hematuria    History of iron deficiency anemia    Abnormal vaginal bleeding-->hysterectomy   History of sexually transmitted disease    trich, gonorrhea, lice   Insomnia    Menopausal symptom    flushing and vag  dryness->HRT 2020 per gyn   Migraine syndrome    S/P total abdominal hysterectomy 11/03/2019   Seasonal allergies    Urinary tract bacterial infections    Vitamin B12 deficiency    02/2023   Vitamin D deficiency    Past Surgical History:  Procedure Laterality Date   CESAREAN SECTION  1990   COLONOSCOPY  02/2019   Dig [facility]a Spec   CYSTOSCOPY N/A 11/03/2019   Procedure: CYSTOSCOPY;  Surgeon: Sherian Rein, MD;  Location: [facility];  Service: Gynecology;  Laterality: N/A;  Possible   EYE SURGERY     Lasik   LAPAROSCOPIC VAGINAL HYSTERECTOMY WITH SALPINGECTOMY Bilateral 11/03/2019   cervical stump remains. Procedure: LAPAROSCOPIC ASSISTED ATTEMPTED  VAGINAL HYSTERECTOMY WITH SALPINGECTOMY, CONVERTED TO ABDOMINAL HYSTERECTOMY;  Surgeon: Sherian Rein, MD;  Location: [facility];  Service: Gynecology;  Laterality: Bilateral;   TUBAL LIGATION     TYMPANOPLASTY  2010   Family History  Problem Relation Age of Onset   Hypertension Mother    Dementia Mother    Heart disease Father    Cancer  Father    Breast cancer Paternal Aunt        68's    Allergies as of 04/15/2023       Reactions   Sulfonamide Derivatives Hives   Penicillins Rash        Medication List        Accurate as of April 14, 2023  3:42 PM. If you have any questions, ask your nurse or doctor.          DULoxetine 30 MG capsule Commonly known as: Cymbalta Take 1 capsule (30 mg total) by mouth daily.   estradiol 0.1 mg/24hr patch Commonly known as: CLIMARA - Dosed in mg/24 hr Place 0.1 mg onto the skin once a week.   estradiol 0.05 mg/24hr patch Commonly known as: CLIMARA - Dosed in mg/24 hr Place 0.05 mg onto the skin once a week.   ferrous sulfate 325 (65 FE) MG tablet Take 1 tablet (325 mg total) by mouth daily with breakfast.   LORazepam 1 MG tablet Commonly known as: ATIVAN Take 1 tablet (1 mg total) by mouth 2 (two) times daily as needed for anxiety.    zolpidem 10 MG tablet Commonly known as: AMBIEN TAKE 1 TABLET BY MOUTH EVERY DAY AT BEDTIME AS NEEDED        All past medical history, surgical history, allergies, family history, immunizations andmedications were updated in the EMR today and reviewed under the history and medication portions of their EMR.     ROS Negative, with the exception of above mentioned in HPI   Objective:  There were no vitals taken for this visit. There is no height or weight on file to calculate BMI.  Physical Exam   No results found. No results found. No results found for this or any previous visit (from the past 24 hours).  Assessment/Plan: Colleen Ruiz is a 57 y.o. female present for OV for  *** Reviewed expectations re: course of current medical issues. Discussed self-management of symptoms. Outlined signs and symptoms indicating need for more acute intervention. Patient verbalized understanding and all questions were answered. Patient received an After-Visit Summary.    No orders of the defined types were placed in this encounter.  No orders of the defined types were placed in this encounter.  Referral Orders  No referral(s) requested today     Note is dictated utilizing voice recognition software. Although note has been proof read prior to signing, occasional typographical errors still can be missed. If any questions arise, please do not hesitate to call for verification.   electronically signed by:  Felix Pacini, DO  [facility] [place]

## 2023-04-15 ENCOUNTER — Ambulatory Visit: Admitting: Family Medicine

## 2023-04-15 ENCOUNTER — Ambulatory Visit (HOSPITAL_BASED_OUTPATIENT_CLINIC_OR_DEPARTMENT_OTHER)
Admission: RE | Admit: 2023-04-15 | Discharge: 2023-04-15 | Disposition: A | Discharge status: Home / Self Care | Source: Ambulatory Visit | Attending: Family Medicine | Admitting: Family Medicine

## 2023-04-15 ENCOUNTER — Encounter: Payer: Self-pay | Admitting: Family Medicine

## 2023-04-15 VITALS — BP 110/62 | HR 83 | Temp 98.4°F | Resp 20 | Ht 70.5 in | Wt 195.5 lb

## 2023-04-15 DIAGNOSIS — R202 Paresthesia of skin: Secondary | ICD-10-CM | POA: Diagnosis not present

## 2023-04-15 DIAGNOSIS — M25562 Pain in left knee: Secondary | ICD-10-CM | POA: Diagnosis present

## 2023-04-15 DIAGNOSIS — M25462 Effusion, left knee: Secondary | ICD-10-CM

## 2023-04-15 NOTE — Patient Instructions (Addendum)
 Return for Follow-up in 1 to 2 weeks with PCP, sooner if worsening.  Have ordered an x-ray of your left knee to rule out knee injury or Baker's cyst, and a ultrasound of your left lower extremity to rule out blood clots.  If you are able to tolerate a baby aspirin, recommend starting 1 a day temporarily with food while we wait on ultrasound study.      Great to see you today.  I have refilled the medication(s) we provide.   If labs were collected or images ordered, we will inform you of  results once we have received them and reviewed. We will contact you either by echart message, or telephone call.  Please give ample time to the testing facility, and our office to run,  receive and review results. Please do not call inquiring of results, even if you can see them in your chart. We will contact you as soon as we are able. If it has been over 1 week since the test was completed, and you have not yet heard from [place], then please call us.    - echart message- for normal results that have been seen by the patient already.   - telephone call: abnormal results or if patient has not viewed results in their echart.  If a referral to a specialist was entered for you, please call us in 2 weeks if you have not heard from the specialist office to schedule.

## 2023-04-16 ENCOUNTER — Encounter: Payer: Self-pay | Admitting: Family Medicine

## 2023-04-25 ENCOUNTER — Other Ambulatory Visit: Payer: Self-pay | Admitting: Family Medicine

## 2023-04-27 NOTE — Telephone Encounter (Signed)
 Requesting: zolpidem 10mg  tablet Contract: 10/03/22 UDS: 12/08/22 Last Visit: 04/15/23 acute Next Visit: 05/13/23 [facility] Last Refill: 10/03/22 (30,5)  Please Advise. Med pending

## 2023-05-11 NOTE — Patient Instructions (Signed)

## 2023-05-13 ENCOUNTER — Ambulatory Visit (INDEPENDENT_AMBULATORY_CARE_PROVIDER_SITE_OTHER): Payer: No Typology Code available for payment source | Admitting: Family Medicine

## 2023-05-13 ENCOUNTER — Encounter: Payer: Self-pay | Admitting: Family Medicine

## 2023-05-13 VITALS — BP 113/78 | HR 80 | Ht 70.5 in | Wt 198.0 lb

## 2023-05-13 DIAGNOSIS — F411 Generalized anxiety disorder: Secondary | ICD-10-CM | POA: Diagnosis not present

## 2023-05-13 DIAGNOSIS — F3340 Major depressive disorder, recurrent, in remission, unspecified: Secondary | ICD-10-CM

## 2023-05-13 DIAGNOSIS — Z79899 Other long term (current) drug therapy: Secondary | ICD-10-CM

## 2023-05-13 DIAGNOSIS — M79605 Pain in left leg: Secondary | ICD-10-CM

## 2023-05-13 DIAGNOSIS — E538 Deficiency of other specified B group vitamins: Secondary | ICD-10-CM | POA: Diagnosis not present

## 2023-05-13 DIAGNOSIS — Z1231 Encounter for screening mammogram for malignant neoplasm of breast: Secondary | ICD-10-CM | POA: Diagnosis not present

## 2023-05-13 DIAGNOSIS — E559 Vitamin D deficiency, unspecified: Secondary | ICD-10-CM

## 2023-05-13 NOTE — Progress Notes (Signed)
 OFFICE VISIT  05/13/2023  CC: No chief complaint on file.   Patient is a 58 y.o. female who presents for 35-month follow-up depression and anxiety. A/P as of last visit: "1 major depressive disorder, in partial remission. Continue 30 mg duloxetine daily.   2.  GAD with superimposed situational anxiety. Increased lorazepam to 1 mg tabs today, 1 twice daily as needed, #60, refill x 2.   3 normocytic anemia. History of iron deficiency due to abnormal uterine bleeding. She is pretty compliant with daily iron tab. Iron panel and B12 level today."  INTERIM HX: [name] feels like she is doing pretty well.  Vitamin B12 level low last visit, started her on oral B12 supplement. Vitamin D was low as well and I started her on 2000 units daily of vitamin D. I recommended she go ahead and stop iron supplement.  She has been compliant with B12 and D supplement since I last saw her.  Prior to that she had not been taking it.  Anxiety level good, uses alprazolam usually once a day but sometimes twice. She feels like mood is improved and she has more productive and overall better sense of wellbeing since being on duloxetine 30 mg daily. Sleep is good on Ambien but some nights she does have to at the lorazepam.  She was seen on 04/15/2023 for various left leg concerns.  Venous Doppler study was negative.  Left knee x-ray was negative. This pain is gone but she still has a little bit of swelling in the medial region of the proximal tibia.  PMP AWARE reviewed today: most recent rx for Ambien was filled 04/27/2023, # 30, rx by me. Most recent lorazepam prescription was filled 04/23/2023, #60, prescription by me. No red flags.  ROS as above, plus--> no fevers, no CP, no SOB, no wheezing, no cough, no dizziness, no HAs, no rashes, no melena/hematochezia.  No polyuria or polydipsia.  No myalgias or arthralgias.  No focal weakness, paresthesias, or tremors.  No acute vision or hearing abnormalities.  No  dysuria or unusual/new urinary urgency or frequency.  No recent changes in lower legs. No n/v/d or abd pain.  No palpitations.    Past Medical History:  Diagnosis Date   Abnormal uterine bleeding    10/2019 LAVH/BS converted to TAH. Path ->fibroids,adenomyosis,nl cervix and fallopian tubes.   Anxiety    History of hematuria    History of iron deficiency anemia    Abnormal vaginal bleeding-->hysterectomy   History of sexually transmitted disease    trich, gonorrhea, lice   Insomnia    Menopausal symptom    flushing and vag dryness->HRT 2020 per gyn   Migraine syndrome    S/P total abdominal hysterectomy 11/03/2019   Seasonal allergies    Urinary tract bacterial infections    Vitamin B12 deficiency    02/2023   Vitamin D deficiency     Past Surgical History:  Procedure Laterality Date   CESAREAN SECTION  1990   COLONOSCOPY  02/2019   [facility]   CYSTOSCOPY N/A 11/03/2019   Procedure: CYSTOSCOPY;  Surgeon: Sherian Rein, MD;  Location: [facility];  Service: Gynecology;  Laterality: N/A;  Possible   EYE SURGERY     Lasik   LAPAROSCOPIC VAGINAL HYSTERECTOMY WITH SALPINGECTOMY Bilateral 11/03/2019   cervical stump remains. Procedure: LAPAROSCOPIC ASSISTED ATTEMPTED  VAGINAL HYSTERECTOMY WITH SALPINGECTOMY, CONVERTED TO ABDOMINAL HYSTERECTOMY;  Surgeon: Sherian Rein, MD;  Location: [facility];  Service: Gynecology;  Laterality: Bilateral;  TUBAL LIGATION     TYMPANOPLASTY  2010    Outpatient Medications Prior to Visit  Medication Sig Dispense Refill   DULoxetine (CYMBALTA) 30 MG capsule Take 1 capsule (30 mg total) by mouth daily. 90 capsule 3   ferrous sulfate 325 (65 FE) MG tablet Take 1 tablet (325 mg total) by mouth daily with breakfast. 60 tablet 1   LORazepam (ATIVAN) 1 MG tablet Take 1 tablet (1 mg total) by mouth 2 (two) times daily as needed for anxiety. 60 tablet 2   zolpidem (AMBIEN) 10 MG tablet TAKE 1 TABLET BY  MOUTH EVERY DAY AT BEDTIME AS NEEDED 30 tablet 5   estradiol (CLIMARA - DOSED IN MG/24 HR) 0.05 mg/24hr patch Place 0.05 mg onto the skin once a week. (Patient not taking: Reported on 05/13/2023)     estradiol (CLIMARA - DOSED IN MG/24 HR) 0.1 mg/24hr patch Place 0.1 mg onto the skin once a week. (Patient not taking: Reported on 05/13/2023)     No facility-administered medications prior to visit.    Allergies  Allergen Reactions   Sulfonamide Derivatives Hives   Penicillins Rash    Review of Systems As per HPI  PE:    05/13/2023    8:15 AM 04/15/2023   11:27 AM 02/12/2023    8:01 AM  Vitals with BMI  Height 5' 10.5" 5' 10.5" 5' 10.5"  Weight 198 lbs 195 lbs 8 oz 203 lbs  BMI 28 27.65 28.71  Systolic 113 110 161  Diastolic 78 62 73  Pulse 80 83 63     Physical Exam  Gen: Alert, well appearing.  Patient is oriented to person, place, time, and situation. AFFECT: pleasant, lucid thought and speech. Medial aspect of the lower leg has a slight soft tissue prominence just distal to the pes anserine region. This is not tender and has no discoloration.  Knee without erythema, warmth, or tenderness.  Range of motion is normal.  Knee is stable. No popliteal mass or tenderness.  LABS:  Last CBC Lab Results  Component Value Date   WBC 5.3 02/12/2023   HGB 12.0 02/12/2023   HCT 36.7 02/12/2023   MCV 87.4 02/12/2023   MCH 29.3 11/04/2019   RDW 13.3 02/12/2023   PLT 251.0 02/12/2023   Last metabolic panel Lab Results  Component Value Date   GLUCOSE 89 02/12/2023   NA 139 02/12/2023   K 4.0 02/12/2023   CL 104 02/12/2023   CO2 28 02/12/2023   BUN 11 02/12/2023   CREATININE 0.70 02/12/2023   GFR 96.15 02/12/2023   CALCIUM 9.0 02/12/2023   PHOS 3.2 08/12/2012   PROT 6.9 02/12/2023   ALBUMIN 4.1 02/12/2023   BILITOT 0.5 02/12/2023   ALKPHOS 80 02/12/2023   AST 16 02/12/2023   ALT 12 02/12/2023   ANIONGAP 5 11/04/2019   Last lipids Lab Results  Component Value Date   CHOL  214 (H) 02/12/2023   HDL 66.60 02/12/2023   LDLCALC 136 (H) 02/12/2023   TRIG 58.0 02/12/2023   CHOLHDL 3 02/12/2023   Lab Results  Component Value Date   IRON 81 02/12/2023   TIBC 249 (L) 02/12/2023   FERRITIN 62 02/12/2023   Lab Results  Component Value Date   VITAMINB12 144 (L) 02/12/2023   Last vitamin D Lab Results  Component Value Date   VD25OH 23.30 (L) 02/12/2023   IMPRESSION AND PLAN:  #1 major depressive disorder and generalized anxiety disorder. All stable/improved on duloxetine 30 mg a day,  lorazepam 1 mg twice daily, and Ambien 10 mg nightly as needed. No new prescriptions were needed today. Urine drug screen and controlled substance contract up-to-date.  2.  Vitamin B12 (intrinsic factor ab negative) and vitamin D deficiencies. She has been back on her supplements daily for the last 3 months. Needs to recheck B12 and vitamin D levels in 3 months.  #3 left leg pain, unknown etiology. Essentially resolved other than some mild medial tibial region soft tissue swelling. Venous Doppler and knee x-ray normal. Observe, return if symptoms return and persist.  An After Visit Summary was printed and given to the patient.  FOLLOW UP: Return in about 3 months (around 08/12/2023) for routine chronic illness f/u. Next CPE 02/2024 Signed:  Santiago Bumpers, MD           05/13/2023

## 2023-05-19 LAB — HM MAMMOGRAPHY

## 2023-05-27 ENCOUNTER — Inpatient Hospital Stay: Admission: RE | Admit: 2023-05-27 | Source: Ambulatory Visit

## 2023-06-12 ENCOUNTER — Telehealth: Payer: Self-pay

## 2023-06-12 MED ORDER — LORAZEPAM 1 MG PO TABS: ORAL_TABLET | ORAL | 2 refills | Status: DC

## 2023-06-12 NOTE — Telephone Encounter (Signed)
 Spoke with pharmacy and cancelled rx; pt has been scheduled

## 2023-06-12 NOTE — Telephone Encounter (Signed)
 I just sent in a new prescription for lorazepam  but I need you to call the pharmacy and cancel it. Encourage patient to make appointment for follow-up to discuss anxiety and insomnia treatment.

## 2023-06-12 NOTE — Telephone Encounter (Signed)
 Copied from CRM [id number]. Topic: Clinical - Prescription Issue >> Jun 11, 2023  4:52 PM Jethro Morrison wrote: Reason for CRM: PT CALLED STATED LORazepam  (ATIVAN ) 1 MG IS NOT WORKING REQUESTING A HIGHER DOSAGE.  Spoke with patient, she has had a few additional stressors. She has only been taking the 2 tablets of Lorazepam , in addition to the Cymbalta .

## 2023-06-12 NOTE — Addendum Note (Signed)
 Addended by: Shelvia Dick on: 06/12/2023 01:53 PM   Modules accepted: Orders

## 2023-06-15 ENCOUNTER — Telehealth (INDEPENDENT_AMBULATORY_CARE_PROVIDER_SITE_OTHER): Admitting: Family Medicine

## 2023-06-15 ENCOUNTER — Encounter: Payer: Self-pay | Admitting: Family Medicine

## 2023-06-15 DIAGNOSIS — F331 Major depressive disorder, recurrent, moderate: Secondary | ICD-10-CM

## 2023-06-15 DIAGNOSIS — Z79899 Other long term (current) drug therapy: Secondary | ICD-10-CM

## 2023-06-15 DIAGNOSIS — F5104 Psychophysiologic insomnia: Secondary | ICD-10-CM

## 2023-06-15 DIAGNOSIS — F411 Generalized anxiety disorder: Secondary | ICD-10-CM

## 2023-06-15 MED ORDER — DULOXETINE HCL 60 MG PO CPEP: 60.0000 mg | ORAL_CAPSULE | Freq: Every day | ORAL | 0 refills | Status: DC

## 2023-06-15 MED ORDER — LORAZEPAM 1 MG PO TABS: ORAL_TABLET | ORAL | 2 refills | Status: DC

## 2023-06-15 NOTE — Progress Notes (Signed)
 Virtual Visit via Video Note  I connected with Colleen Ruiz  on 06/15/23 at  4:00 PM EDT by a video enabled telemedicine application and verified that I am speaking with the correct person using two identifiers.  Location patient: [place] Location provider:work or home office Persons participating in the virtual visit: patient, provider  I discussed the limitations and requested verbal permission for telemedicine visit. The patient expressed understanding and agreed to proceed.  CC: 58 year old female being seen today for follow-up anxiety and insomnia. I last saw her 1 mo ago: A/P as of last visit: "#1 major depressive disorder and generalized anxiety disorder. All stable/improved on duloxetine  30 mg a day, lorazepam  1 mg twice daily, and Ambien  10 mg nightly as needed. No new prescriptions were needed today. Urine drug screen and controlled substance contract up-to-date.   2.  Vitamin B12 (intrinsic factor ab negative) and vitamin D  deficiencies. She has been back on her supplements daily for the last 3 months. Needs to recheck B12 and vitamin D  levels in 3 months.   #3 left leg pain, unknown etiology. Essentially resolved other than some mild medial tibial region soft tissue swelling. Venous Doppler and knee x-ray normal. Observe, return if symptoms return and persist."  INTERIM HX: Not long after I last saw her she started getting increased depressed mood and anxiety.  She did have a close person in her life die unexpectedly. She ruminates over depressed thoughts, has anhedonia, hopelessness, crying spells. She takes 2 of the lorazepam  in the evening to go to sleep and does initiate sleep okay but wakes up several hours later and cannot go back to sleep.  Her mind is sharp and will not shut down.  Denies suicidal ideation or homicidal ideation. Husband is very supportive.  She no longer takes Ambien  because she says it did not help very well.   PMP AWARE reviewed today: most recent rx  for lorazepam  was filled for 05/21/23, # 60, rx by me.  Most recent Ambien  prescription filled 1/61/0960, #30, prescription by me. No red flags.  Review of systems: No headaches, no dizziness, no nausea, no abdominal pain, no rash, no diarrhea, no palpitations.  Past Medical History:  Diagnosis Date   Abnormal uterine bleeding    10/2019 LAVH/BS converted to TAH. Path ->fibroids,adenomyosis,nl cervix and fallopian tubes.   Anxiety    History of hematuria    History of iron deficiency anemia    Abnormal vaginal bleeding-->hysterectomy   History of sexually transmitted disease    trich, gonorrhea, lice   Insomnia    Menopausal symptom    flushing and vag dryness->HRT 2020 per gyn   Migraine syndrome    S/P total abdominal hysterectomy 11/03/2019   Seasonal allergies    Urinary tract bacterial infections    Vitamin B12 deficiency    02/2023   Vitamin D  deficiency     Past Surgical History:  Procedure Laterality Date   CESAREAN SECTION  1990   COLONOSCOPY  02/2019   Dig [facility]a Spec   CYSTOSCOPY N/A 11/03/2019   Procedure: CYSTOSCOPY;  Surgeon: Margaretmary Shaver, MD;  Location: [facility];  Service: Gynecology;  Laterality: N/A;  Possible   EYE SURGERY     Lasik   LAPAROSCOPIC VAGINAL HYSTERECTOMY WITH SALPINGECTOMY Bilateral 11/03/2019   cervical stump remains. Procedure: LAPAROSCOPIC ASSISTED ATTEMPTED  VAGINAL HYSTERECTOMY WITH SALPINGECTOMY, CONVERTED TO ABDOMINAL HYSTERECTOMY;  Surgeon: Margaretmary Shaver, MD;  Location: [facility];  Service: Gynecology;  Laterality: Bilateral;   TUBAL  LIGATION     TYMPANOPLASTY  2010     Current Outpatient Medications:    DULoxetine  (CYMBALTA ) 30 MG capsule, Take 1 capsule (30 mg total) by mouth daily., Disp: 90 capsule, Rfl: 3   estradiol (CLIMARA - DOSED IN MG/24 HR) 0.05 mg/24hr patch, Place 0.05 mg onto the skin once a week. (Patient not taking: Reported on 05/13/2023), Disp: , Rfl:    estradiol  (CLIMARA - DOSED IN MG/24 HR) 0.1 mg/24hr patch, Place 0.1 mg onto the skin once a week. (Patient not taking: Reported on 05/13/2023), Disp: , Rfl:    ferrous sulfate  325 (65 FE) MG tablet, Take 1 tablet (325 mg total) by mouth daily with breakfast., Disp: 60 tablet, Rfl: 1   LORazepam  (ATIVAN ) 1 MG tablet, 1 tab po qAM and 2 tabs po qhs, Disp: 90 tablet, Rfl: 2   zolpidem  (AMBIEN ) 10 MG tablet, TAKE 1 TABLET BY MOUTH EVERY DAY AT BEDTIME AS NEEDED, Disp: 30 tablet, Rfl: 5  EXAM:  VITALS per patient if applicable:     05/13/2023    8:15 AM 04/15/2023   11:27 AM 02/12/2023    8:01 AM  Vitals with BMI  Height 5' 10.5" 5' 10.5" 5' 10.5"  Weight 198 lbs 195 lbs 8 oz 203 lbs  BMI 28 27.65 28.71  Systolic 113 110 865  Diastolic 78 62 73  Pulse 80 83 63     GENERAL: alert, oriented, appears well and in no acute distress  HEENT: atraumatic, conjunttiva clear, no obvious abnormalities on inspection of external nose and ears  NECK: normal movements of the head and neck  LUNGS: on inspection no signs of respiratory distress, breathing rate appears normal, no obvious gross SOB, gasping or wheezing  CV: no obvious cyanosis  MS: moves all visible extremities without noticeable abnormality  PSYCH/NEURO: pleasant and cooperative, no obvious depression or anxiety, speech and thought processing grossly intact  LABS: none today  ASSESSMENT AND PLAN:  Discussed the following assessment and plan:  Recurrent major depressive disorder, GAD, insomnia. Increase Cymbalta  to 60 mg a day. She is taking the maximum lorazepam : Continue 1 mg every morning and okay to take 2 mg nightly. She has discontinued Ambien  and I will take this off of her medication list.   Lorazepam  1 mg, 1 tab every morning and 2 tabs nightly, #90, refill x 2  Reviewed good sleep hygiene and self-care. She is going to take several days off work for vacation in the next week or so.  I discussed the assessment and treatment plan  with the patient. The patient was provided an opportunity to ask questions and all were answered. The patient agreed with the plan and demonstrated an understanding of the instructions.   F/u: 2 weeks  Signed:  Arletha Lady, MD           06/15/2023

## 2023-07-19 ENCOUNTER — Other Ambulatory Visit: Payer: Self-pay | Admitting: Family Medicine

## 2023-08-12 ENCOUNTER — Ambulatory Visit: Admitting: Family Medicine

## 2023-11-04 ENCOUNTER — Ambulatory Visit: Payer: Self-pay

## 2023-11-04 NOTE — Telephone Encounter (Signed)
 No further action needed at this time.

## 2023-11-04 NOTE — Telephone Encounter (Signed)
 FYI Only or Action Required?: Action required by provider: request for appointment.  Patient was last seen in primary care on 06/15/2023 by McGowen, Aleene DEL, MD.  Called Nurse Triage reporting Eye Problem.  Symptoms began several days ago.  Interventions attempted: Nothing.  Symptoms are: unchanged.  Triage Disposition: See PCP When Office is Open (Within 3 Days)  Patient/caregiver understands and will follow disposition?: YesCopied from CRM #[id number]. Topic: Clinical - Red Word Triage >> Nov 04, 2023 10:14 AM Carlyon D wrote: Red Word that prompted transfer to Nurse Triage: right eye upper feels like sty possibly, eye is swollen and painful. Reason for Disposition  [1] MILD eyelid swelling (puffiness) AND [2] persists > 3 days  (Exception: Suspect mosquito bites.)  Answer Assessment - Initial Assessment Questions I've had this before and he gives me some kind of Vaseline that makes it go away. Pt denies any vision changes. Pt requested to see only PCP and needed Friday appt.      1. ONSET: When did the swelling start? (e.g., minutes, hours, days)     sunday 2. LOCATION: What part of the eyelid is swollen?     Right eye upper lid 3. SEVERITY: How swollen is it? (e.g., describe; mild, moderate, or severe)     moderate 4. ITCHING: Is there any itching? If Yes, ask: How much?   (Scale 1-10; mild, moderate or severe)     denies 5. PAIN: Is the swelling painful to touch? If Yes, ask: How painful is it?   (Scale 1-10; mild, moderate or severe)     6 6. FEVER: Do you have a fever? If Yes, ask: What is it, how was it measured, and when did it start?      denies 7. CAUSE: What do you think is causing the swelling?     sty 8. RECURRENT SYMPTOM: Have you had eyelid swelling before? If Yes, ask: When was the last time? What happened that time?     na 9. OTHER SYMPTOMS: Do you have any other symptoms? (e.g., blurred vision, eye discharge, rash, runny nose)      denies  Protocols used: Eyelid Swelling-A-AH

## 2023-11-06 ENCOUNTER — Encounter: Payer: Self-pay | Admitting: Family Medicine

## 2023-11-06 ENCOUNTER — Ambulatory Visit (INDEPENDENT_AMBULATORY_CARE_PROVIDER_SITE_OTHER): Admitting: Family Medicine

## 2023-11-06 VITALS — BP 106/73 | HR 63 | Temp 98.0°F | Ht 70.5 in | Wt 185.2 lb

## 2023-11-06 DIAGNOSIS — F411 Generalized anxiety disorder: Secondary | ICD-10-CM

## 2023-11-06 DIAGNOSIS — G4700 Insomnia, unspecified: Secondary | ICD-10-CM

## 2023-11-06 DIAGNOSIS — H00011 Hordeolum externum right upper eyelid: Secondary | ICD-10-CM | POA: Diagnosis not present

## 2023-11-06 DIAGNOSIS — F3342 Major depressive disorder, recurrent, in full remission: Secondary | ICD-10-CM

## 2023-11-06 DIAGNOSIS — Z79899 Other long term (current) drug therapy: Secondary | ICD-10-CM

## 2023-11-06 MED ORDER — LORAZEPAM 1 MG PO TABS: ORAL_TABLET | ORAL | 5 refills | Status: DC

## 2023-11-06 MED ORDER — ERYTHROMYCIN 5 MG/GM OP OINT: 1.0000 | TOPICAL_OINTMENT | Freq: Three times a day (TID) | OPHTHALMIC | 0 refills | Status: AC

## 2023-11-06 MED ORDER — ZOLPIDEM TARTRATE 10 MG PO TABS: 10.0000 mg | ORAL_TABLET | Freq: Every evening | ORAL | 1 refills | Status: AC | PRN

## 2023-11-06 NOTE — Progress Notes (Signed)
 OFFICE VISIT  11/06/2023  CC:  Chief Complaint  Patient presents with   Eye Concern    Patient is a 58 y.o. female who presents for concern about her eye.  HPI: Recent right eye swelling and pain, small bump near the eyelash line on the upper lid. She did some ice application and it actually started to get a lot better yesterday and is gone today.  She says her anxiety and mood are stable lately.  Says the duloxetine  60 mg was too strong.  She has been taking lorazepam  1 mg in the morning and 2 mg around 6 PM.  She takes an Ambien  around 9 or 10:00.  She finds this regimen very effective.    PMP AWARE reviewed today: most recent rx for Ambien  10 mg was filled 10/13/23, #30 rx by me.  Most recent lorazepam  prescription filled 09/30/2023, #90, prescription by me No red flags.  Review of systems: No tremor, no headaches, no abdominal pain, no constipation, no acute vision disturbance.  Past Medical History:  Diagnosis Date   Abnormal uterine bleeding    10/2019 LAVH/BS converted to TAH. Path ->fibroids,adenomyosis,nl cervix and fallopian tubes.   Anxiety    History of hematuria    History of iron deficiency anemia    Abnormal vaginal bleeding-->hysterectomy   History of sexually transmitted disease    trich, gonorrhea, lice   Insomnia    Menopausal symptom    flushing and vag dryness->HRT 2020 per gyn   Migraine syndrome    S/P total abdominal hysterectomy 11/03/2019   Seasonal allergies    Urinary tract bacterial infections    Vitamin B12 deficiency    02/2023   Vitamin D  deficiency     Past Surgical History:  Procedure Laterality Date   CESAREAN SECTION  1990   COLONOSCOPY  02/2019   Dig [facility]a Spec   CYSTOSCOPY N/A 11/03/2019   Procedure: CYSTOSCOPY;  Surgeon: Danielle Rom, MD;  Location: [facility];  Service: Gynecology;  Laterality: N/A;  Possible   EYE SURGERY     Lasik   LAPAROSCOPIC VAGINAL HYSTERECTOMY WITH SALPINGECTOMY Bilateral  11/03/2019   cervical stump remains. Procedure: LAPAROSCOPIC ASSISTED ATTEMPTED  VAGINAL HYSTERECTOMY WITH SALPINGECTOMY, CONVERTED TO ABDOMINAL HYSTERECTOMY;  Surgeon: Danielle Rom, MD;  Location: [facility];  Service: Gynecology;  Laterality: Bilateral;   TUBAL LIGATION     TYMPANOPLASTY  2010    Outpatient Medications Prior to Visit  Medication Sig Dispense Refill   estradiol (CLIMARA - DOSED IN MG/24 HR) 0.05 mg/24hr patch Place 0.05 mg onto the skin once a week.     ferrous sulfate  325 (65 FE) MG tablet Take 1 tablet (325 mg total) by mouth daily with breakfast. 60 tablet 1   LORazepam  (ATIVAN ) 1 MG tablet 1 tab po qAM and 2 tabs po qhs 90 tablet 2   zolpidem  (AMBIEN ) 10 MG tablet Take 10 mg by mouth at bedtime as needed.     DULoxetine  (CYMBALTA ) 60 MG capsule Take 1 capsule by mouth once daily (Patient not taking: Reported on 11/06/2023) 30 capsule 0   No facility-administered medications prior to visit.    Allergies  Allergen Reactions   Sulfonamide Derivatives Hives   Penicillins Rash    Review of Systems  As per HPI  PE:    11/06/2023    8:01 AM 05/13/2023    8:15 AM 04/15/2023   11:27 AM  Vitals with BMI  Height 5' 10.5 5' 10.5 5' 10.5  Weight 185 lbs 3 oz 198 lbs 195 lbs 8 oz  BMI 26.19 28 27.65  Systolic 106 113 889  Diastolic 73 78 62  Pulse 63 80 83     Physical Exam  Gen: Alert, well appearing.  Patient is oriented to person, place, time, and situation. AFFECT: pleasant, lucid thought and speech. Both eyes without any swelling or erythema.  No nodules or pustules.  No conjunctival or palpebral injection.  LABS:  none  IMPRESSION AND PLAN:  #1 right eye hordeolum. Resolved. I did prescribe erythromycin  gel today for her to have on hand since she has had a couple of these in the past.  #2 recurrent major depressive disorder, GAD. She has discontinued her duloxetine  and says she is doing well. We will continue lorazepam  1  mg in the morning and 2 mg around 5 or 6 PM. She will continue zolpidem  10 mg nightly. Refills were done today.  An After Visit Summary was printed and given to the patient.  FOLLOW UP: No follow-ups on file.  Signed:  Gerlene Hockey, MD           11/06/2023

## 2023-12-07 LAB — HM COLONOSCOPY

## 2024-01-12 ENCOUNTER — Telehealth: Payer: Self-pay

## 2024-01-12 NOTE — Telephone Encounter (Signed)
 Pt's chart reviewed to see if she has any upcoming appts with PCP, last OV 9/26 but no f/u information available.  Please confirm when pt should follow up

## 2024-01-13 NOTE — Telephone Encounter (Signed)
 No further action needed at this time.

## 2024-01-13 NOTE — Telephone Encounter (Signed)
 Cpe April 2026

## 2024-01-15 ENCOUNTER — Telehealth: Admitting: Family Medicine

## 2024-01-15 ENCOUNTER — Encounter: Payer: Self-pay | Admitting: Family Medicine

## 2024-01-15 DIAGNOSIS — J069 Acute upper respiratory infection, unspecified: Secondary | ICD-10-CM | POA: Diagnosis not present

## 2024-01-15 DIAGNOSIS — J209 Acute bronchitis, unspecified: Secondary | ICD-10-CM

## 2024-01-15 MED ORDER — PREDNISONE 20 MG PO TABS: ORAL_TABLET | ORAL | 0 refills | Status: DC

## 2024-01-15 MED ORDER — ALBUTEROL SULFATE HFA 108 (90 BASE) MCG/ACT IN AERS: 2.0000 | INHALATION_SPRAY | Freq: Four times a day (QID) | RESPIRATORY_TRACT | 0 refills | Status: DC | PRN

## 2024-01-15 MED ORDER — AZITHROMYCIN 250 MG PO TABS: ORAL_TABLET | ORAL | 0 refills | Status: DC

## 2024-01-15 MED ORDER — BENZONATATE 200 MG PO CAPS: 200.0000 mg | ORAL_CAPSULE | Freq: Three times a day (TID) | ORAL | 0 refills | Status: DC | PRN

## 2024-01-15 NOTE — Progress Notes (Signed)
 Virtual Visit via Video Note  I connected with Colleen Ruiz  on 01/15/24 at  2:40 PM EST by a video enabled telemedicine application and verified that I am speaking with the correct person using two identifiers.  Location patient: [facility] Location provider:work or home office Persons participating in the virtual visit: patient, provider  I discussed the limitations and requested verbal permission for telemedicine visit. The patient expressed understanding and agreed to proceed.   HPI: 58 year old female being seen today for cough. 10-day history of nasal congestion/runny nose, postnasal drip, and cough. No fever.  She has some sore throat, particularly when she coughs. No shortness of breath or wheezing. No body aches. No nausea, vomiting, diarrhea, or rash.  She has tried some over-the-counter cough/cold medicine.  ROS: See pertinent positives and negatives per HPI.  Past Medical History:  Diagnosis Date   Abnormal uterine bleeding    10/2019 LAVH/BS converted to TAH. Path ->fibroids,adenomyosis,nl cervix and fallopian tubes.   Anxiety    History of hematuria    History of iron deficiency anemia    Abnormal vaginal bleeding-->hysterectomy   History of sexually transmitted disease    trich, gonorrhea, lice   Insomnia    Menopausal symptom    flushing and vag dryness->HRT 2020 per gyn   Migraine syndrome    S/P total abdominal hysterectomy 11/03/2019   Seasonal allergies    Urinary tract bacterial infections    Vitamin B12 deficiency    02/2023   Vitamin D  deficiency     Past Surgical History:  Procedure Laterality Date   CESAREAN SECTION  1990   COLONOSCOPY  02/2019   [facility]   CYSTOSCOPY N/A 11/03/2019   Procedure: CYSTOSCOPY;  Surgeon: Danielle Rom, MD;  Location: [facility];  Service: Gynecology;  Laterality: N/A;  Possible   EYE SURGERY     Lasik   LAPAROSCOPIC VAGINAL HYSTERECTOMY WITH SALPINGECTOMY Bilateral 11/03/2019   cervical stump  remains. Procedure: LAPAROSCOPIC ASSISTED ATTEMPTED  VAGINAL HYSTERECTOMY WITH SALPINGECTOMY, CONVERTED TO ABDOMINAL HYSTERECTOMY;  Surgeon: Danielle Rom, MD;  Location: [facility];  Service: Gynecology;  Laterality: Bilateral;   TUBAL LIGATION     TYMPANOPLASTY  2010     Current Outpatient Medications:    estradiol (CLIMARA - DOSED IN MG/24 HR) 0.05 mg/24hr patch, Place 0.05 mg onto the skin once a week., Disp: , Rfl:    ferrous sulfate  325 (65 FE) MG tablet, Take 1 tablet (325 mg total) by mouth daily with breakfast., Disp: 60 tablet, Rfl: 1   LORazepam  (ATIVAN ) 1 MG tablet, 1 tab po qAM and 2 tabs po qhs, Disp: 90 tablet, Rfl: 5   zolpidem  (AMBIEN ) 10 MG tablet, Take 1 tablet (10 mg total) by mouth at bedtime as needed., Disp: 90 tablet, Rfl: 1  EXAM:  VITALS per patient if applicable:     11/06/2023    8:01 AM 05/13/2023    8:15 AM 04/15/2023   11:27 AM  Vitals with BMI  Height 5' 10.5 5' 10.5 5' 10.5  Weight 185 lbs 3 oz 198 lbs 195 lbs 8 oz  BMI 26.19 28 27.65  Systolic 106 113 889  Diastolic 73 78 62  Pulse 63 80 83    GENERAL: alert, oriented, appears well and in no acute distress  HEENT: atraumatic, conjunttiva clear, no obvious abnormalities on inspection of external nose and ears  NECK: normal movements of the head and neck  LUNGS: on inspection no signs of respiratory distress, breathing rate  appears normal, no obvious gross SOB, gasping or wheezing  CV: no obvious cyanosis  MS: moves all visible extremities without noticeable abnormality  PSYCH/NEURO: pleasant and cooperative, no obvious depression or anxiety, speech and thought processing grossly intact  LABS: none today    Chemistry      Component Value Date/Time   NA 139 02/12/2023 0820   K 4.0 02/12/2023 0820   CL 104 02/12/2023 0820   CO2 28 02/12/2023 0820   BUN 11 02/12/2023 0820   CREATININE 0.70 02/12/2023 0820   CREATININE 0.72 11/11/2021 0821      Component Value  Date/Time   CALCIUM 9.0 02/12/2023 0820   ALKPHOS 80 02/12/2023 0820   AST 16 02/12/2023 0820   ALT 12 02/12/2023 0820   BILITOT 0.5 02/12/2023 0820     ASSESSMENT AND PLAN:  Discussed the following assessment and plan:  Prolonged URI with cough/bronchitis. Prednisone  40 mg a day x 5 days. Albuterol  HFA, 2 puffs every 6 hour as needed. Z-Pak. Tessalon  Perles 200 mg, 1 3 times daily as needed.   I discussed the assessment and treatment plan with the patient. The patient was provided an opportunity to ask questions and all were answered. The patient agreed with the plan and demonstrated an understanding of the instructions.   F/u: If not significantly improving in 3 to 4 days.  Signed:  Gerlene Hockey, MD           01/15/2024

## 2024-03-09 ENCOUNTER — Encounter: Payer: Self-pay | Admitting: Family Medicine

## 2024-03-09 ENCOUNTER — Ambulatory Visit: Admitting: Family Medicine

## 2024-03-09 VITALS — BP 111/78 | HR 67 | Temp 97.7°F | Ht 69.0 in | Wt 174.0 lb

## 2024-03-09 DIAGNOSIS — Z0000 Encounter for general adult medical examination without abnormal findings: Secondary | ICD-10-CM

## 2024-03-09 DIAGNOSIS — F411 Generalized anxiety disorder: Secondary | ICD-10-CM | POA: Diagnosis not present

## 2024-03-09 DIAGNOSIS — Z13220 Encounter for screening for lipoid disorders: Secondary | ICD-10-CM

## 2024-03-09 DIAGNOSIS — E538 Deficiency of other specified B group vitamins: Secondary | ICD-10-CM | POA: Diagnosis not present

## 2024-03-09 DIAGNOSIS — F99 Mental disorder, not otherwise specified: Secondary | ICD-10-CM

## 2024-03-09 DIAGNOSIS — E611 Iron deficiency: Secondary | ICD-10-CM | POA: Diagnosis not present

## 2024-03-09 DIAGNOSIS — E559 Vitamin D deficiency, unspecified: Secondary | ICD-10-CM

## 2024-03-09 DIAGNOSIS — Z79899 Other long term (current) drug therapy: Secondary | ICD-10-CM

## 2024-03-09 DIAGNOSIS — Z23 Encounter for immunization: Secondary | ICD-10-CM | POA: Diagnosis not present

## 2024-03-09 DIAGNOSIS — F5105 Insomnia due to other mental disorder: Secondary | ICD-10-CM

## 2024-03-09 MED ORDER — LORAZEPAM 1 MG PO TABS: ORAL_TABLET | ORAL | 5 refills | Status: AC

## 2024-03-09 MED ORDER — FERROUS SULFATE 325 (65 FE) MG PO TABS: 325.0000 mg | ORAL_TABLET | Freq: Every day | ORAL | 11 refills | Status: AC

## 2024-03-09 NOTE — Progress Notes (Signed)
 "     Office Note 03/09/2024  CC:  Chief Complaint  Patient presents with   Annual Exam    Pt is fasting   Patient is a 59 y.o. female who is here for annual health maintenance exam and 16-month follow-up depression and anxiety and insomnia. A/P as of last visit: Recurrent major depressive disorder, GAD. She has discontinued her duloxetine  and says she is doing well. We will continue lorazepam  1 mg in the morning and 2 mg around 5 or 6 PM. She will continue zolpidem  10 mg nightly. Refills were done today.  INTERIM HX: [name] has been feeling well. She has made great strides in improving her diet and exercise levels. She has lost 20+ pounds over the last year.  B12 was low last January but she has been on supplement since that time.  History of iron deficiency but has not been taking iron because the over-the-counter formulation made her stool green. She has a remote history of excessive vaginal bleeding which was the initial cause of her iron deficiency.  She takes vitamin D  supplement daily.  Anxiety very well-controlled when she takes lorazepam  1 to 2 mg twice a day. Ambien  very helpful for sleep.  PMP AWARE reviewed today: most recent rx for lorazepam  1 mg was filled 02/09/2024, # 90, rx by me.  Most recent Ambien  prescription was filled 01/13/2024, #30, prescription by me. No red flags.  Past Medical History:  Diagnosis Date   Abnormal uterine bleeding    10/2019 LAVH/BS converted to TAH. Path ->fibroids,adenomyosis,nl cervix and fallopian tubes.   Anemia    Anxiety    History of hematuria    History of iron deficiency anemia    Abnormal vaginal bleeding-->hysterectomy   History of sexually transmitted disease    trich, gonorrhea, lice   Insomnia    Menopausal symptom    flushing and vag dryness->HRT 2020 per gyn   Migraine syndrome    S/P total abdominal hysterectomy 11/03/2019   Seasonal allergies    Urinary tract bacterial infections    Vitamin B12 deficiency     02/2023   Vitamin D  deficiency     Past Surgical History:  Procedure Laterality Date   ABDOMINAL HYSTERECTOMY  10/30/2018   CESAREAN SECTION  1990   COLONOSCOPY  02/2019   [facility]   CYSTOSCOPY N/A 11/03/2019   Procedure: CYSTOSCOPY;  Surgeon: Danielle Rom, MD;  Location: [facility];  Service: Gynecology;  Laterality: N/A;  Possible   EYE SURGERY     Lasik   LAPAROSCOPIC VAGINAL HYSTERECTOMY WITH SALPINGECTOMY Bilateral 11/03/2019   cervical stump remains. Procedure: LAPAROSCOPIC ASSISTED ATTEMPTED  VAGINAL HYSTERECTOMY WITH SALPINGECTOMY, CONVERTED TO ABDOMINAL HYSTERECTOMY;  Surgeon: Danielle Rom, MD;  Location: [facility];  Service: Gynecology;  Laterality: Bilateral;   TUBAL LIGATION     TYMPANOPLASTY  2010    Family History  Problem Relation Age of Onset   Hypertension Mother    Dementia Mother    Alcohol abuse Mother    Arthritis Mother    Heart disease Father    Cancer Father    Breast cancer Paternal Aunt        39's     Social History   Socioeconomic History   Marital status: Married    Spouse name: Not on file   Number of children: 2   Years of education: Not on file   Highest education level: Some college, no degree  Occupational History  Employer: [employer]  Tobacco Use   Smoking status: Never   Smokeless tobacco: Never  Vaping Use   Vaping status: Never Used  Substance and Sexual Activity   Alcohol use: Never   Drug use: Never   Sexual activity: Yes    Birth control/protection: Surgical  Other Topics Concern   Not on file  Social History Narrative   Colleen Ruiz lives with her husband, works for [employer], and has two grown children. Her son serves in the [place]  [employer] and is currently serving in [place].  She will be going to [place] 04/2014 to visit him.  Her daughter studied psychology at [employer] and has returned for graduate studies.    No T/A/Ds.   Social Drivers of Health    Tobacco Use: Low Risk (03/09/2024)   Patient History    Smoking Tobacco Use: Never    Smokeless Tobacco Use: Never    Passive Exposure: Not on file  Financial Resource Strain: Low Risk (03/08/2024)   Overall Financial Resource Strain (CARDIA)    Difficulty of Paying Living Expenses: Not hard at all  Food Insecurity: No Food Insecurity (03/08/2024)   Epic    Worried About Radiation Protection Practitioner of Food in the Last Year: Never true    Ran Out of Food in the Last Year: Never true  Transportation Needs: No Transportation Needs (03/08/2024)   Epic    Lack of Transportation (Medical): No    Lack of Transportation (Non-Medical): No  Physical Activity: Sufficiently Active (03/08/2024)   Exercise Vital Sign    Days of Exercise per Week: 4 days    Minutes of Exercise per Session: 40 min  Stress: Stress Concern Present (03/08/2024)   [employer] of Occupational Health - Occupational Stress Questionnaire    Feeling of Stress: To some extent  Social Connections: Socially Integrated (03/08/2024)   Social Connection and Isolation Panel    Frequency of Communication with Friends and Family: More than three times a week    Frequency of Social Gatherings with Friends and Family: More than three times a week    Attends Religious Services: More than 4 times per year    Active Member of [employer] or Organizations: Yes    Attends Banker Meetings: More than 4 times per year    Marital Status: Married  Catering Manager Violence: Unknown (05/15/2021)   Received from [facility]   HITS    Physically Hurt: Not on file    Insult or Talk Down To: Not on file    Threaten Physical Harm: Not on file    Scream or Curse: Not on file  Depression (PHQ2-9): Medium Risk (03/09/2024)   Depression (PHQ2-9)    PHQ-2 Score: 9  Alcohol Screen: Not on file  Housing: Low Risk (03/08/2024)   Epic    Unable to Pay for Housing in the Last Year: No    Number of Times Moved in the Last Year: 0    Homeless in the Last  Year: No  Utilities: Not on file  Health Literacy: Not on file    Outpatient Medications Prior to Visit  Medication Sig Dispense Refill   estradiol (CLIMARA - DOSED IN MG/24 HR) 0.1 mg/24hr patch Place 0.1 mg onto the skin once a week.     zolpidem  (AMBIEN ) 10 MG tablet Take 1 tablet (10 mg total) by mouth at bedtime as needed. 90 tablet 1   ferrous sulfate  325 (65 FE) MG tablet Take 1 tablet (325 mg total) by  mouth daily with breakfast. 60 tablet 1   LORazepam  (ATIVAN ) 1 MG tablet 1 tab po qAM and 2 tabs po qhs 90 tablet 5   albuterol  (VENTOLIN  HFA) 108 (90 Base) MCG/ACT inhaler Inhale 2 puffs into the lungs every 6 (six) hours as needed for wheezing or shortness of breath. 8 g 0   azithromycin  (ZITHROMAX ) 250 MG tablet 2 tabs po qd x 1d, then 1 tab po qd x 4d 6 tablet 0   benzonatate  (TESSALON ) 200 MG capsule Take 1 capsule (200 mg total) by mouth 3 (three) times daily as needed for cough. 20 capsule 0   estradiol (CLIMARA - DOSED IN MG/24 HR) 0.05 mg/24hr patch Place 0.05 mg onto the skin once a week.     predniSONE  (DELTASONE ) 20 MG tablet 2 tabs po every day x 5d 10 tablet 0   No facility-administered medications prior to visit.    Allergies[1]  Review of Systems  Constitutional:  Negative for appetite change, chills, fatigue and fever.  HENT:  Negative for congestion, dental problem, ear pain and sore throat.   Eyes:  Negative for discharge, redness and visual disturbance.  Respiratory:  Negative for cough, chest tightness, shortness of breath and wheezing.   Cardiovascular:  Negative for chest pain, palpitations and leg swelling.  Gastrointestinal:  Negative for abdominal pain, blood in stool, diarrhea, nausea and vomiting.  Genitourinary:  Negative for difficulty urinating, dysuria, flank pain, frequency, hematuria and urgency.  Musculoskeletal:  Negative for arthralgias, back pain, joint swelling, myalgias and neck stiffness.  Skin:  Negative for pallor and rash.   Neurological:  Negative for dizziness, speech difficulty, weakness and headaches.  Hematological:  Negative for adenopathy. Does not bruise/bleed easily.  Psychiatric/Behavioral:  Negative for confusion and sleep disturbance. The patient is not nervous/anxious.     PE;    03/09/2024    2:16 PM 11/06/2023    8:01 AM 05/13/2023    8:15 AM  Vitals with BMI  Height 5' 9 5' 10.5 5' 10.5  Weight 174 lbs 185 lbs 3 oz 198 lbs  BMI 25.68 26.19 28  Systolic 111 106 886  Diastolic 78 73 78  Pulse 67 63 80  Exam chaperoned by Shanda Pizza, CMA  Gen: Alert, well appearing.  Patient is oriented to person, place, time, and situation. AFFECT: pleasant, lucid thought and speech. ENT: Ears: EACs clear, normal epithelium.  TMs with good light reflex and landmarks bilaterally.  Eyes: no injection, icteris, swelling, or exudate.  EOMI, PERRLA. Nose: no drainage or turbinate edema/swelling.  No injection or focal lesion.  Mouth: lips without lesion/swelling.  Oral mucosa pink and moist.  Dentition intact and without obvious caries or gingival swelling.  Oropharynx without erythema, exudate, or swelling.  Neck: supple/nontender.  No LAD, mass, or TM.  Carotid pulses 2+ bilaterally, without bruits. CV: RRR, no m/r/g.   LUNGS: CTA bilat, nonlabored resps, good aeration in all lung fields. ABD: soft, NT, ND, BS normal.  No hepatospenomegaly or mass.  No bruits. EXT: no clubbing, cyanosis, or edema.  Musculoskeletal: no joint swelling, erythema, warmth, or tenderness.  ROM of all joints intact. Skin - no sores or suspicious lesions or rashes or color changes  Pertinent labs:  Lab Results  Component Value Date   TSH 1.93 11/11/2021   Lab Results  Component Value Date   WBC 5.3 02/12/2023   HGB 12.0 02/12/2023   HCT 36.7 02/12/2023   MCV 87.4 02/12/2023   PLT 251.0 02/12/2023  Lab Results  Component Value Date   IRON 81 02/12/2023   TIBC 249 (L) 02/12/2023   FERRITIN 62 02/12/2023    Lab  Results  Component Value Date   CREATININE 0.70 02/12/2023   BUN 11 02/12/2023   NA 139 02/12/2023   K 4.0 02/12/2023   CL 104 02/12/2023   CO2 28 02/12/2023   Lab Results  Component Value Date   ALT 12 02/12/2023   AST 16 02/12/2023   ALKPHOS 80 02/12/2023   BILITOT 0.5 02/12/2023   Lab Results  Component Value Date   CHOL 214 (H) 02/12/2023   Lab Results  Component Value Date   HDL 66.60 02/12/2023   Lab Results  Component Value Date   LDLCALC 136 (H) 02/12/2023   Lab Results  Component Value Date   TRIG 58.0 02/12/2023   Lab Results  Component Value Date   CHOLHDL 3 02/12/2023   Lab Results  Component Value Date   HGBA1C 5.4 08/15/2014   Lab Results  Component Value Date   VITAMINB12 144 (L) 02/12/2023   Lab Results  Component Value Date   IRON 81 02/12/2023   TIBC 249 (L) 02/12/2023   FERRITIN 62 02/12/2023   ASSESSMENT AND PLAN:   1 health maintenance exam: Reviewed age and gender appropriate health maintenance issues (prudent diet, regular exercise, health risks of tobacco and excessive alcohol, use of seatbelts, fire alarms in home, use of sunscreen).  Also reviewed age and gender appropriate health screening as well as vaccine recommendations. Vaccines: Shingirx->deferred.  Flu-->declined.  Prevnar 20->given today. Labs: fasting HP, iron panel, vit B12. Cervical ca screening: per GYN MD. Breast ca screening: per GYN MD.  11/2021 abnl mammo-->diagnostic showed fibrocystic changes.  April 2025 normal--->pt will call to arrange at GYN office. Colon ca screening: recall 2031  Doing great with purposeful weight loss efforts!  #2 history of vitamin D  deficiency. She takes daily over-the-counter supplement.  Check vitamin D  level today.  3.  Vitamin B12 deficiency. On daily oral supplement. Check vitamin B12 level today.  4.  History of iron deficiency anemia. Monitor hemoglobin and iron panel today.  We may get her back on iron depending on her  level.  5.  GAD.  She has anxiety-induced insomnia. History of depression but she is in remission. Continue lorazepam  1 mg tabs.  She can take 1-2 tabs twice a day as needed.  Will dispense 100/month. Urine drug screen today. She can continue Ambien  10 mg nightly as needed.  An After Visit Summary was printed and given to the patient.  FOLLOW UP:  No follow-ups on file.  Signed:  Gerlene Hockey, MD           03/09/2024     [1]  Allergies Allergen Reactions   Sulfonamide Derivatives Hives   Penicillins Rash   "

## 2024-03-10 LAB — LIPID PANEL
Cholesterol: 174 mg/dL (ref 28–200)
HDL: 53.3 mg/dL
LDL Cholesterol: 109 mg/dL — ABNORMAL HIGH (ref 10–99)
NonHDL: 121.18
Total CHOL/HDL Ratio: 3
Triglycerides: 62 mg/dL (ref 10.0–149.0)
VLDL: 12.4 mg/dL (ref 0.0–40.0)

## 2024-03-10 LAB — COMPREHENSIVE METABOLIC PANEL WITH GFR
ALT: 15 U/L (ref 3–35)
AST: 15 U/L (ref 5–37)
Albumin: 4.3 g/dL (ref 3.5–5.2)
Alkaline Phosphatase: 66 U/L (ref 39–117)
BUN: 18 mg/dL (ref 6–23)
CO2: 31 meq/L (ref 19–32)
Calcium: 9.7 mg/dL (ref 8.4–10.5)
Chloride: 105 meq/L (ref 96–112)
Creatinine, Ser: 0.67 mg/dL (ref 0.40–1.20)
GFR: 96.44 mL/min
Glucose, Bld: 89 mg/dL (ref 70–99)
Potassium: 3.9 meq/L (ref 3.5–5.1)
Sodium: 141 meq/L (ref 135–145)
Total Bilirubin: 0.3 mg/dL (ref 0.2–1.2)
Total Protein: 7.2 g/dL (ref 6.0–8.3)

## 2024-03-10 LAB — CBC WITH DIFFERENTIAL/PLATELET
Basophils Absolute: 0.1 10*3/uL (ref 0.0–0.1)
Basophils Relative: 2.4 % (ref 0.0–3.0)
Eosinophils Absolute: 0.1 10*3/uL (ref 0.0–0.7)
Eosinophils Relative: 1.2 % (ref 0.0–5.0)
HCT: 36.8 % (ref 36.0–46.0)
Hemoglobin: 12.1 g/dL (ref 12.0–15.0)
Lymphocytes Relative: 41.8 % (ref 12.0–46.0)
Lymphs Abs: 1.8 10*3/uL (ref 0.7–4.0)
MCHC: 32.9 g/dL (ref 30.0–36.0)
MCV: 85.8 fl (ref 78.0–100.0)
Monocytes Absolute: 0.2 10*3/uL (ref 0.1–1.0)
Monocytes Relative: 4.8 % (ref 3.0–12.0)
Neutro Abs: 2.2 10*3/uL (ref 1.4–7.7)
Neutrophils Relative %: 49.8 % (ref 43.0–77.0)
Platelets: 260 10*3/uL (ref 150.0–400.0)
RBC: 4.28 Mil/uL (ref 3.87–5.11)
RDW: 13.9 % (ref 11.5–15.5)
WBC: 4.4 10*3/uL (ref 4.0–10.5)

## 2024-03-10 LAB — DRUG MONITOR, PANEL 1, SCREEN, URINE
Amphetamines: NEGATIVE ng/mL
Barbiturates: NEGATIVE ng/mL
Benzodiazepines: POSITIVE ng/mL — AB
Cocaine Metabolite: NEGATIVE ng/mL
Creatinine: 105.9 mg/dL
Marijuana Metabolite: NEGATIVE ng/mL
Methadone Metabolite: NEGATIVE ng/mL
Opiates: NEGATIVE ng/mL
Oxidant: NEGATIVE ug/mL
Oxycodone: NEGATIVE ng/mL
Phencyclidine: NEGATIVE ng/mL
pH: 6 (ref 4.5–9.0)

## 2024-03-10 LAB — IRON,TIBC AND FERRITIN PANEL
%SAT: 26 % (ref 16–45)
Ferritin: 89 ng/mL (ref 16–232)
Iron: 66 ug/dL (ref 45–160)
TIBC: 253 ug/dL (ref 250–450)

## 2024-03-10 LAB — VITAMIN D 25 HYDROXY (VIT D DEFICIENCY, FRACTURES): VITD: 34.53 ng/mL (ref 30.00–100.00)

## 2024-03-10 LAB — DM TEMPLATE

## 2024-03-10 LAB — TSH: TSH: 1.37 u[IU]/mL (ref 0.35–5.50)

## 2024-03-10 LAB — VITAMIN B12: Vitamin B-12: 839 pg/mL (ref 211–911)

## 2024-03-11 ENCOUNTER — Encounter: Payer: Self-pay | Admitting: Family Medicine

## 2024-03-11 ENCOUNTER — Ambulatory Visit: Payer: Self-pay | Admitting: Family Medicine

## 2024-03-16 ENCOUNTER — Encounter: Payer: Self-pay | Admitting: Family Medicine

## 2024-03-16 NOTE — Telephone Encounter (Signed)
 Please advise if okay to place referral for GI. Pt had CPE completed on 03/09/24

## 2024-03-16 NOTE — Telephone Encounter (Signed)
 Copied from CRM [id number]. Topic: Clinical - Request for Lab/Test Order >> Mar 15, 2024  2:27 PM Carlyon D wrote: Reason for CRM: PT is calling in regards to a colonoscopy she is asking if the dr needs to refer her to go somewhere she is asking for a call back to further discuss.

## 2024-03-16 NOTE — Progress Notes (Signed)
 Records requested
